# Patient Record
Sex: Male | Born: 1975 | Race: White | Hispanic: No | Marital: Married | Smoking: Never smoker
Health system: Southern US, Community
[De-identification: ages and names within clinical notes are randomized; demographics above are authoritative.]

## PROBLEM LIST (undated history)

## (undated) DIAGNOSIS — R531 Weakness: Secondary | ICD-10-CM

## (undated) DIAGNOSIS — F419 Anxiety disorder, unspecified: Secondary | ICD-10-CM

## (undated) DIAGNOSIS — R4701 Aphasia: Secondary | ICD-10-CM

## (undated) DIAGNOSIS — G4730 Sleep apnea, unspecified: Secondary | ICD-10-CM

## (undated) HISTORY — DX: Anxiety disorder, unspecified: F41.9

## (undated) HISTORY — DX: Weakness: R53.1

## (undated) HISTORY — DX: Aphasia: R47.01

## (undated) HISTORY — PX: VENOUS ABLATION: SHX2656

## (undated) HISTORY — PX: GALLBLADDER SURGERY: SHX652

---

## 2015-04-02 DIAGNOSIS — M25519 Pain in unspecified shoulder: Secondary | ICD-10-CM | POA: Insufficient documentation

## 2015-11-17 DIAGNOSIS — M79606 Pain in leg, unspecified: Secondary | ICD-10-CM | POA: Insufficient documentation

## 2015-11-17 DIAGNOSIS — R609 Edema, unspecified: Secondary | ICD-10-CM | POA: Insufficient documentation

## 2016-06-08 DIAGNOSIS — R600 Localized edema: Secondary | ICD-10-CM | POA: Insufficient documentation

## 2016-08-16 DIAGNOSIS — G4733 Obstructive sleep apnea (adult) (pediatric): Secondary | ICD-10-CM

## 2016-08-16 HISTORY — DX: Obstructive sleep apnea (adult) (pediatric): G47.33

## 2016-08-16 HISTORY — DX: Morbid (severe) obesity due to excess calories: E66.01

## 2019-04-01 DIAGNOSIS — M6208 Separation of muscle (nontraumatic), other site: Secondary | ICD-10-CM

## 2019-04-01 DIAGNOSIS — I8393 Asymptomatic varicose veins of bilateral lower extremities: Secondary | ICD-10-CM | POA: Insufficient documentation

## 2019-04-01 HISTORY — DX: Asymptomatic varicose veins of bilateral lower extremities: I83.93

## 2019-04-01 HISTORY — DX: Separation of muscle (nontraumatic), other site: M62.08

## 2019-10-01 ENCOUNTER — Other Ambulatory Visit: Payer: Self-pay | Admitting: Nurse Practitioner

## 2019-10-01 DIAGNOSIS — R42 Dizziness and giddiness: Secondary | ICD-10-CM

## 2019-10-26 ENCOUNTER — Other Ambulatory Visit: Payer: Self-pay | Admitting: Orthopedic Surgery

## 2019-10-26 DIAGNOSIS — M25561 Pain in right knee: Secondary | ICD-10-CM

## 2019-10-28 ENCOUNTER — Other Ambulatory Visit: Payer: Self-pay

## 2019-10-28 ENCOUNTER — Ambulatory Visit
Admission: RE | Admit: 2019-10-28 | Discharge: 2019-10-28 | Disposition: A | Discharge status: Home / Self Care | Payer: 59 | Source: Ambulatory Visit | Attending: Orthopedic Surgery | Admitting: Orthopedic Surgery

## 2019-10-28 DIAGNOSIS — M25561 Pain in right knee: Secondary | ICD-10-CM

## 2019-10-28 IMAGING — MR MR KNEE*R* W/O CM
6 series · 40 of 40 positions shown · non-contrast
Comparison: None.

CLINICAL DATA: Right knee pain status post fall. Injured playing
basketball.

EXAM:
MRI OF THE RIGHT KNEE WITHOUT CONTRAST
TECHNIQUE: Multiplanar, multisequence MR imaging of the knee was performed. No
intravenous contrast was administered.

[Series 3: T2 fat-sat · axial · right · 4.0mm · 0.62mm/px · z∈[-38,+97]mm · 6 of 32 slices shown (1 of 3)]
[im 1/32]
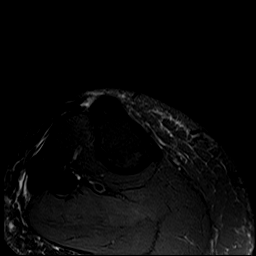
[im 7/32]
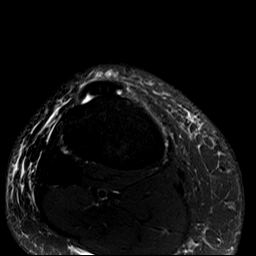
[im 13/32]
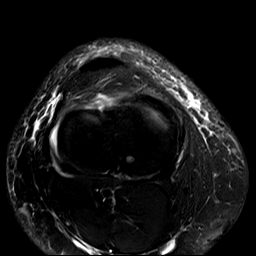
[im 19/32]
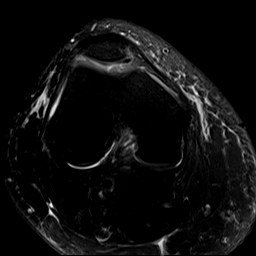
[im 25/32]
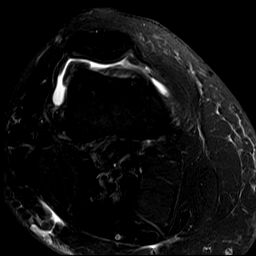
[im 32/32]
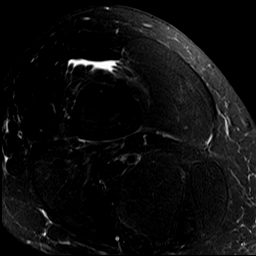

[Series 4: T2 fat-sat · coronal · right · 4.0mm · 0.78mm/px · 6 of 29 slices shown (2 of 3)]
[im 1/29]
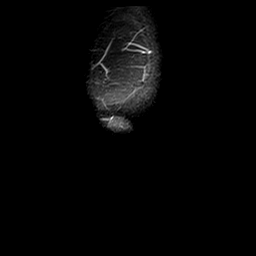
[im 6/29]
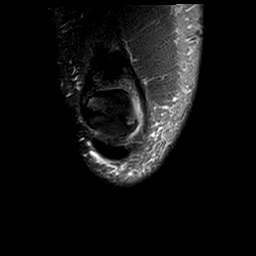
[im 12/29]
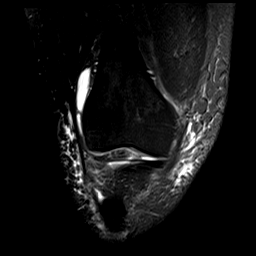
[im 17/29]
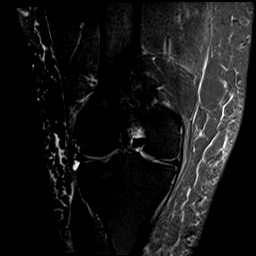
[im 23/29]
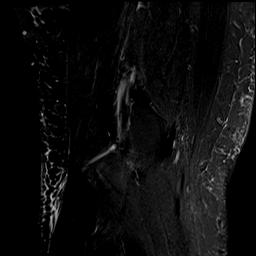
[im 29/29]
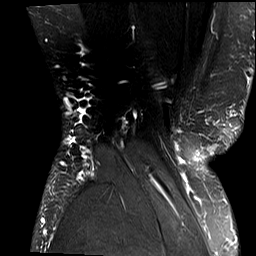

[Series 5: T1 · coronal · right · 4.0mm · 0.62mm/px · 7 of 29 slices shown]
[im 1/29]
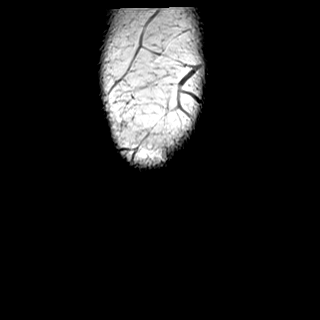
[im 5/29]
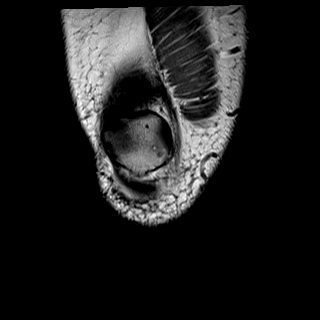
[im 10/29]
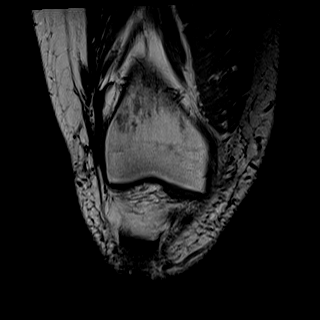
[im 15/29]
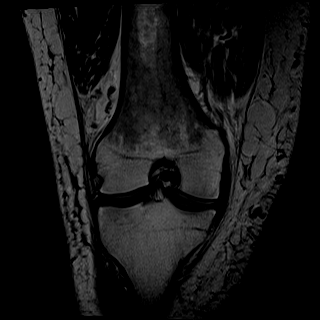
[im 19/29]
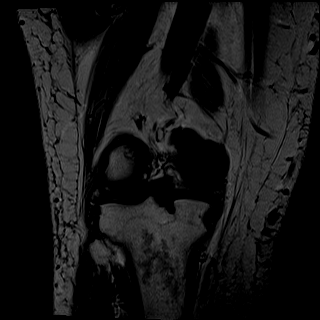
[im 24/29]
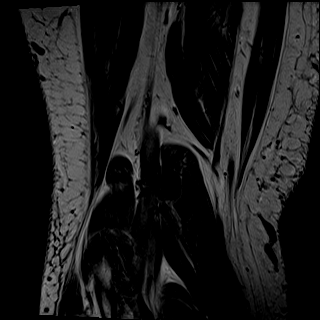
[im 29/29]
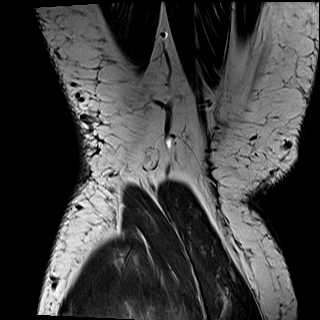

[Series 6: PD fat-sat · coronal · right · 3.3mm · 0.78mm/px · 7 of 29 slices shown (1 of 2)]
[im 1/29]
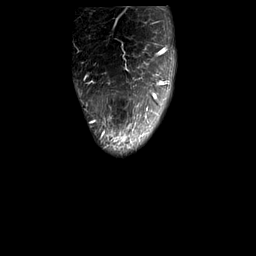
[im 5/29]
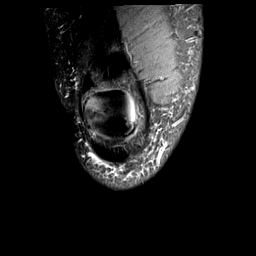
[im 10/29]
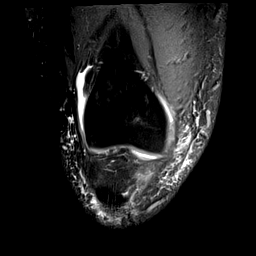
[im 15/29]
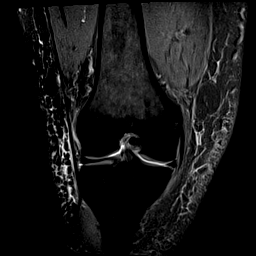
[im 19/29]
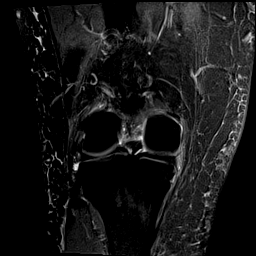
[im 24/29]
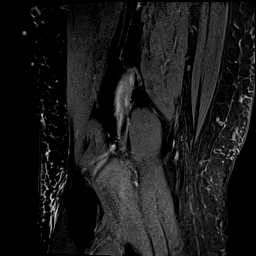
[im 29/29]
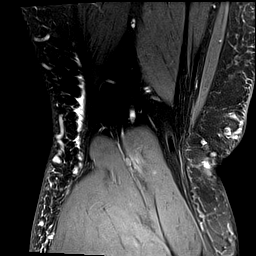

[Series 7: PD fat-sat · sagittal · right · 3.3mm · 0.50mm/px · 7 of 29 slices shown (2 of 2)]
[im 1/29]
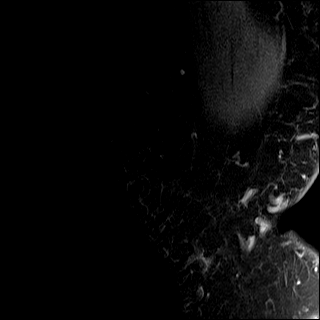
[im 5/29]
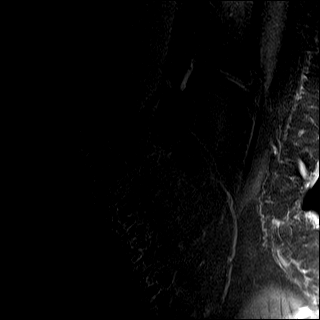
[im 10/29]
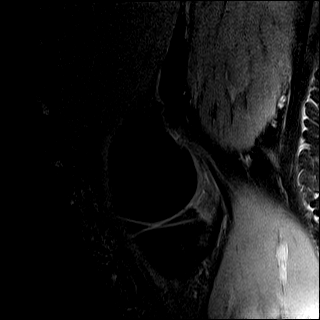
[im 15/29]
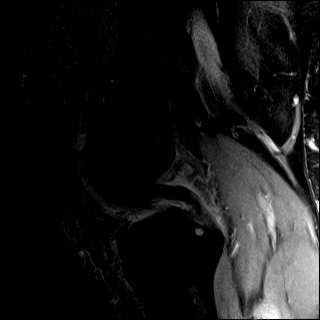
[im 19/29]
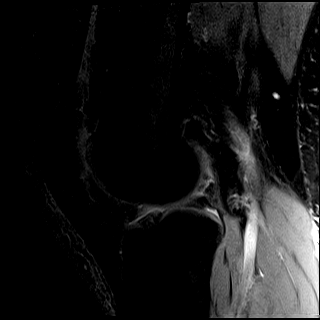
[im 24/29]
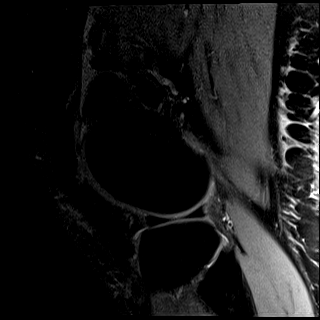
[im 29/29]
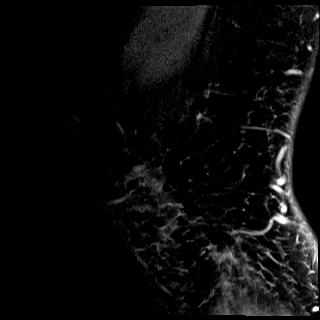

[Series 8: T2 fat-sat · sagittal · right · 3.3mm · 0.62mm/px · 7 of 29 slices shown (3 of 3)]
[im 1/29]
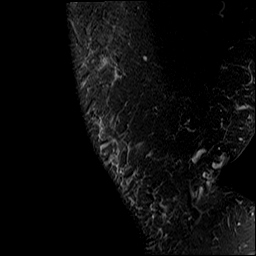
[im 5/29]
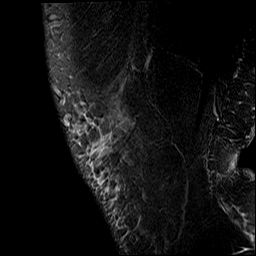
[im 10/29]
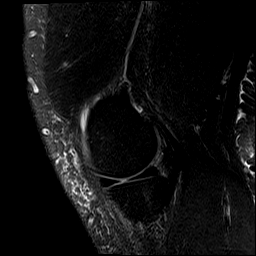
[im 15/29]
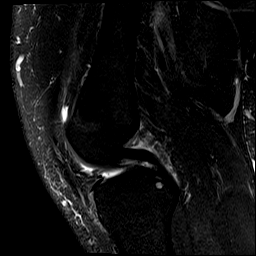
[im 19/29]
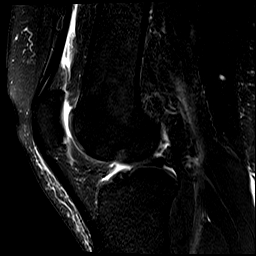
[im 24/29]
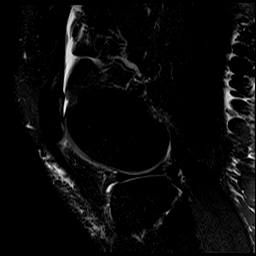
[im 29/29]
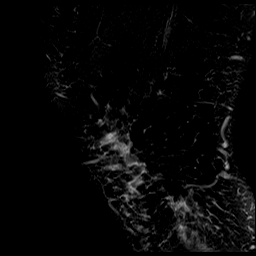

[40 of 40 positions shown; findings below may reference images not displayed]

FINDINGS: MENISCI

Medial meniscus: Degeneration of the body of the medial meniscus
with fraying along the undersurface. No discrete tear.

Lateral meniscus:  Intact.

LIGAMENTS

Cruciates:  Intact ACL and PCL.

Collaterals: Medial collateral ligament is intact. Lateral
collateral ligament complex is intact.

CARTILAGE

Patellofemoral: Partial-thickness cartilage loss of the lateral
patellar facet. Focal area of full-thickness cartilage loss of the
medial trochlea with subchondral reactive marrow edema.

Medial: Partial-thickness cartilage loss of the medial femorotibial
compartment.

Lateral:  No chondral defect.

Joint: Small joint effusion. Normal Hoffa's fat. No plical
thickening.

Popliteal Fossa:  No Baker cyst. Intact popliteus tendon.

Extensor Mechanism: Intact quadriceps tendon. Intact patellar
tendon. Intact medial patellar retinaculum. Intact lateral patellar
retinaculum. Intact MPFL.

Bones:  No acute osseous abnormality. No aggressive osseous lesion.

Other: No fluid collection or hematoma.  Muscles are normal.
IMPRESSION: 1. Degeneration of the body of the medial meniscus with fraying
along the undersurface without a discrete tear.
2. Partial-thickness cartilage loss of the medial femorotibial
compartment.
3. Partial-thickness cartilage loss of the lateral patellar facet.
Focal area of full-thickness cartilage loss of the medial trochlea
with subchondral reactive marrow edema.
4. Small joint effusion.

## 2019-11-22 ENCOUNTER — Other Ambulatory Visit: Payer: Self-pay

## 2020-04-27 ENCOUNTER — Telehealth (HOSPITAL_COMMUNITY): Payer: Self-pay | Admitting: Adult Health

## 2020-04-27 NOTE — Telephone Encounter (Signed)
Called patient about COVID positivity and his interest in receiving monoclonal antibody treatment.  He is feeling better and does not wish to come in any longer.  I wished him a continued speedy recovery.  Lillard Anes, NP

## 2020-09-20 ENCOUNTER — Other Ambulatory Visit: Payer: Self-pay

## 2020-09-20 DIAGNOSIS — R7303 Prediabetes: Secondary | ICD-10-CM | POA: Insufficient documentation

## 2020-09-20 DIAGNOSIS — I8393 Asymptomatic varicose veins of bilateral lower extremities: Secondary | ICD-10-CM

## 2020-09-20 HISTORY — DX: Prediabetes: R73.03

## 2020-10-09 ENCOUNTER — Other Ambulatory Visit: Payer: Self-pay

## 2020-10-09 ENCOUNTER — Encounter: Payer: Self-pay | Admitting: Vascular Surgery

## 2020-10-09 ENCOUNTER — Ambulatory Visit (INDEPENDENT_AMBULATORY_CARE_PROVIDER_SITE_OTHER): Payer: 59 | Admitting: Vascular Surgery

## 2020-10-09 ENCOUNTER — Ambulatory Visit (HOSPITAL_COMMUNITY)
Admission: RE | Admit: 2020-10-09 | Discharge: 2020-10-09 | Disposition: A | Discharge status: Home / Self Care | Payer: 59 | Source: Ambulatory Visit | Attending: Vascular Surgery | Admitting: Vascular Surgery

## 2020-10-09 VITALS — BP 123/84 | HR 79 | Temp 98.3°F | Resp 20 | Ht 71.0 in | Wt 298.0 lb

## 2020-10-09 DIAGNOSIS — I872 Venous insufficiency (chronic) (peripheral): Secondary | ICD-10-CM | POA: Diagnosis not present

## 2020-10-09 DIAGNOSIS — I8393 Asymptomatic varicose veins of bilateral lower extremities: Secondary | ICD-10-CM | POA: Insufficient documentation

## 2020-10-09 NOTE — Progress Notes (Signed)
Patient ID: Faolan Springfield, male   DOB: 1975/10/30, 45 y.o.   MRN: [id number]  Reason for Consult: New Patient (Initial Visit)   Referred by Julianne Handler, NP  Subjective:     HPI:  Roy Snyder is a 45 y.o. male has a history several years ago when he was living in [place] of what appears to be first saphenous vein stripping followed by saphenous vein ablation and also possible sclerotherapy.  He states this has occurred on both legs.  All procedures did help for a short period of time.  He has persistent swelling of the left greater than right leg with skin changes bilaterally left greater than right.  He also has some recurrent varicosities on the left side.  He states that his legs do ache particularly at the end of the day.  He currently wears knee-high compression stockings religiously.  He was previously in thigh-high compression stockings after his previous procedures.  He has no tissue loss or ulceration.  He has never had arterial treatments.  Past Medical History:  Diagnosis Date  . Anxiety    History reviewed. No pertinent family history. Past Surgical History:  Procedure Laterality Date  . VENOUS ABLATION Bilateral     Short Social History:  Social History   Tobacco Use  . Smoking status: Never Smoker  . Smokeless tobacco: Never Used  Substance Use Topics  . Alcohol use: Not Currently    No Known Allergies  Current Outpatient Medications  Medication Sig Dispense Refill  . cholecalciferol (VITAMIN D3) 25 MCG (1000 UNIT) tablet Take 1,000 Units by mouth daily.    . vitamin C (ASCORBIC ACID) 500 MG tablet Take 500 mg by mouth daily.    Marland Kitchen zinc gluconate 50 MG tablet Take 50 mg by mouth daily.    Marland Kitchen escitalopram (LEXAPRO) 10 MG tablet Take 10 mg by mouth daily.     No current facility-administered medications for this visit.    Review of Systems  Constitutional:  Constitutional negative. HENT: HENT negative.  Eyes: Eyes negative.  Respiratory:  Respiratory negative.  Cardiovascular: Positive for leg swelling.  GI: Gastrointestinal negative.  Skin:       Skin discoloration bilateral legs Neurological: Neurological negative. Hematologic: Hematologic/lymphatic negative.  Psychiatric: Psychiatric negative.        Objective:  Objective   Vitals:   10/09/20 1349  BP: 123/84  Pulse: 79  Resp: 20  Temp: 98.3 F (36.8 C)  SpO2: 96%  Weight: 298 lb (135.2 kg)  Height: 5\' 11"  (1.803 m)   Body mass index is 41.56 kg/m.  Physical Exam HENT:     Head: Normocephalic.     Nose:     Comments: Wearing a mask Cardiovascular:     Pulses: Normal pulses.  Pulmonary:     Effort: Pulmonary effort is normal.  Abdominal:     General: Abdomen is flat.  Musculoskeletal:     Right lower leg: Edema present.     Left lower leg: Edema present.  Skin:    Capillary Refill: Capillary refill takes less than 2 seconds.     Comments: Hemosiderin deposition left greater than right ankle  Neurological:     General: No focal deficit present.     Mental Status: He is alert.  Psychiatric:        Mood and Affect: Mood normal.        Behavior: Behavior normal.        Thought Content: Thought content normal.  Judgment: Judgment normal.     Data: Venous Reflux Times  +--------------+---------+------+-----------+------------+-----------------  ----+  RIGHT     Reflux NoRefluxReflux TimeDiameter cmsComments                      Yes                          +--------------+---------+------+-----------+------------+-----------------  ----+  CFV            yes  >1 second                    +--------------+---------+------+-----------+------------+-----------------  ----+  FV mid          yes  >1 second                    +--------------+---------+------+-----------+------------+-----------------  ----+   Popliteal   no                                +--------------+---------+------+-----------+------------+-----------------  ----+  GSV at [facility]        yes  >500 ms   0.45               +--------------+---------+------+-----------+------------+-----------------  ----+  GSV prox thigh      yes  >500 ms   0.34  vein courses in  GSV                              fascia ? remnant  GSV   +--------------+---------+------+-----------+------------+-----------------  ----+  GSV mid thigh       yes  >500 ms   0.42  vein courses in  GSV                              fascia ? remnant  GSV   +--------------+---------+------+-----------+------------+-----------------  ----+  GSV dist thigh      yes  >500 ms   0.55  vein courses in  GSV                              fascia ? remnant  GSV   +--------------+---------+------+-----------+------------+-----------------  ----+  SSV Pop Fossa no               0.2                +--------------+---------+------+-----------+------------+-----------------  ----+  SSV prox calf no               0.19               +--------------+---------+------+-----------+------------+-----------------  ----+  SSV mid calf no               0.2                +--------------+---------+------+-----------+------------+-----------------  ----+     +--------------+---------+------+-----------+------------+--------------+  LEFT     Reflux NoRefluxReflux TimeDiameter cmsComments                  Yes                      +--------------+---------+------+-----------+------------+--------------+  CFV             yes  >1 second                +--------------+---------+------+-----------+------------+--------------+  FV mid          yes  >1 second                +--------------+---------+------+-----------+------------+--------------+  Popliteal         yes  >1 second                +--------------+---------+------+-----------+------------+--------------+  GSV at [facility]        yes  >500 ms   0.62           +--------------+---------+------+-----------+------------+--------------+  GSV prox thighno               0.35           +--------------+---------+------+-----------+------------+--------------+  GSV mid thigh                     not visualized  +--------------+---------+------+-----------+------------+--------------+  GSV dist thigh                    not visualized  +--------------+---------+------+-----------+------------+--------------+  SSV Pop Fossa       yes  >500 ms   0.8            +--------------+---------+------+-----------+------------+--------------+  SSV prox calf       yes  >500 ms   0.79           +--------------+---------+------+-----------+------------+--------------+  SSV mid calf       yes  >500 ms   0.43           +--------------+---------+------+-----------+------------+--------------+         Assessment/Plan:     45 year old male with previous history of bilateral lower extremity venous interventions including stripping, ablation and what appears to be sclerotherapy.  He does have possible accessory saphenous or recurrent saphenous vein reflux on the right with a moderately sized vein and relatively mild swelling skin changes consistent with C4 a venous disease.  On the left side he also has C4 a venous disease  with what appears to be a large small saphenous vein that is refluxing.  We have discussed fitting him for thigh-high compression stockings.  He will follow-up in 3 months for consideration of left small saphenous vein ablation and evaluation for right greater saphenous vein recurrent reflux.     Maeola Harman MD [facility]

## 2020-10-11 ENCOUNTER — Ambulatory Visit: Payer: 59 | Admitting: Sports Medicine

## 2020-10-17 ENCOUNTER — Ambulatory Visit: Payer: 59 | Admitting: Sports Medicine

## 2020-10-17 ENCOUNTER — Encounter: Payer: Self-pay | Admitting: Sports Medicine

## 2020-10-17 DIAGNOSIS — B070 Plantar wart: Secondary | ICD-10-CM

## 2020-10-17 DIAGNOSIS — M79672 Pain in left foot: Secondary | ICD-10-CM

## 2020-10-17 DIAGNOSIS — R7303 Prediabetes: Secondary | ICD-10-CM

## 2020-10-17 NOTE — Progress Notes (Signed)
Subjective: Roy Snyder is a 44 y.o. male patient who presents to office for evaluation of Left foot pain secondary to moderately painful wart at the plantar mid-arch. Patient has not tried anything with no relief in symptoms. Patient denies any other pedal complaints.   Review of Systems  All other systems reviewed and are negative.    Patient Active Problem List   Diagnosis Date Noted  . Prediabetes 09/20/2020  . Asymptomatic varicose veins of both lower extremities 04/01/2019  . Diastasis recti 04/01/2019  . Morbid obesity with BMI of 40.0-44.9, adult (HCC) 08/16/2016  . Obstructive sleep apnea syndrome 08/16/2016    Current Outpatient Medications on File Prior to Visit  Medication Sig Dispense Refill  . cholecalciferol (VITAMIN D3) 25 MCG (1000 UNIT) tablet Take 1,000 Units by mouth daily.    Marland Kitchen escitalopram (LEXAPRO) 10 MG tablet Take 10 mg by mouth daily.    . vitamin C (ASCORBIC ACID) 500 MG tablet Take 500 mg by mouth daily.    Marland Kitchen zinc gluconate 50 MG tablet Take 50 mg by mouth daily.     No current facility-administered medications on file prior to visit.    No Known Allergies  Objective:  General: Alert and oriented x3 in no acute distress  Dermatology: Keratotic lesion present measures approximately less than 0.5cm at plantar mid-arch on left with no skin lines transversing the lesion, pain is present with medial lateral pressure to the lesion, capillaries with pin point bleeding noted, no webspace macerations, no ecchymosis bilateral, all nails x 10 are well manicured.  Vascular: Dorsalis Pedis and Posterior Tibial pedal pulses 2/4, Capillary Fill Time 3 seconds, + pedal hair growth bilateral, no edema bilateral lower extremities, Temperature gradient within normal limits.  Neurology: Michaell Cowing sensation intact via light touch bilateral.  Musculoskeletal: Mild tenderness with palpation at the lesion site on left arch, Muscular strength 5/5 in all groups without pain or  limitation on range of motion. + Pes planus foot type.   Assessment and Plan: Problem List Items Addressed This Visit      Other   Prediabetes    Other Visit Diagnoses    Plantar wart of left foot    -  Primary   Left foot pain          -Complete examination performed -Discussed treatment options for wart -Parred keratoic warty lesion using a chisel blade x 1 left plantar mid-arch; treated the area with Catharidin covered with bandaid; Advised patient of blistering reaction that will occur from application of medication and once this happens replace bandaid with neosporin and tape/bandaid -Advised good hygiene habits and spraying out shoes weekly using Lysol -Patient to return to office in 1 month or sooner if condition worsens.  Asencion Islam, DPM

## 2020-11-15 ENCOUNTER — Ambulatory Visit: Payer: 59 | Admitting: Sports Medicine

## 2020-11-29 ENCOUNTER — Ambulatory Visit: Payer: 59 | Admitting: Sports Medicine

## 2021-01-11 ENCOUNTER — Ambulatory Visit (INDEPENDENT_AMBULATORY_CARE_PROVIDER_SITE_OTHER): Payer: 59 | Admitting: Vascular Surgery

## 2021-01-11 ENCOUNTER — Other Ambulatory Visit: Payer: Self-pay

## 2021-01-11 ENCOUNTER — Encounter: Payer: Self-pay | Admitting: Vascular Surgery

## 2021-01-11 VITALS — BP 124/81 | HR 75 | Temp 98.5°F | Resp 18 | Ht 71.0 in | Wt 300.0 lb

## 2021-01-11 DIAGNOSIS — I872 Venous insufficiency (chronic) (peripheral): Secondary | ICD-10-CM

## 2021-01-11 NOTE — Progress Notes (Signed)
REASON FOR VISIT:   Chronic venous insufficiency.  Follow-up visit.  MEDICAL ISSUES:   CHRONIC VENOUS INSUFFICIENCY: This patient has CEAP C4a venous disease.  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  He has for the most part deep venous reflux with some superficial venous reflux which by my exam is not amenable to laser angioplasty at this point.  We have discussed the importance of intermittent leg elevation and the proper positioning for this.  In addition I have encouraged him to continue to wear his compression stockings.  I think it knee-high compression stocking with a gradient of 15 to 20 mmHg would be more practical at this time of year.  I have encouraged him to avoid prolonged sitting and standing.  We have discussed the importance of exercise specifically walking and water aerobics.  In addition we have discussed the importance of maintaining healthy weight as central obesity especially increases lower extremity venous pressure.  If his symptoms progress in the future then certainly we could repeat his duplex to see if there is been any progression of his superficial venous disease.  In addition I have explained that in the winter especially it is important to keep the skin well lubricated to prevent any cracks in the skin which can result in ulceration.   HPI:   Roy Snyder is a pleasant 45 y.o. male  who was seen by Dr. Randie Heinz on 10/09/2020.  According to the notes several years ago when he was living in [place] he had a saphenous vein stripping and saphenous vein ablation and possible sclerotherapy.  He had procedures in both legs.  He was having some persistent swelling in both legs and also some skin changes bilaterally.  These were more significant on the left side than the right.  He had some recurrent varicose veins in the left leg also.  On his recent venous duplex scan there was evidence of an anterior accessory saphenous vein on the right and reflux and a small saphenous  vein on the left.  He had CEAP C4a venous disease.  He was encouraged to wear thigh-high compression stockings with a gradient of 20 to 30 mmHg, elevate his legs, and exercise.  He comes in for 1-month follow-up visit.  On my history, the patient states that he has had multiple previous venous procedures on both legs.  It sounds like he has had laser ablation of both great saphenous veins, stab phlebectomies, and even had glue in both saphenous veins bilaterally.  He also describes a procedure where he was placed prone which would suggest that he had laser ablation of the small saphenous veins also.  This was all done 6 or 7 years ago according to the patient.  These procedures were done in [facility] and also in [place].  I do not have these records.  He denies any previous history of DVT.  He has had significant pain in both legs which she describes as aching pain and heaviness.  The symptoms are aggravated by standing and driving for a prolonged period of time.  His symptoms are alleviated with elevation.  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  He does do a fair amount of driving.  The compression stockings have not really helped significantly.  He does elevate his legs which does help.  His symptoms have been going on for a couple years and have been gradually progressive.  Past Medical History:  Diagnosis Date  Anxiety     No family history on file.  SOCIAL HISTORY: Social History   Tobacco Use   Smoking status: Never   Smokeless tobacco: Never  Substance Use Topics   Alcohol use: Not Currently    No Known Allergies  Current Outpatient Medications  Medication Sig Dispense Refill   cholecalciferol (VITAMIN D3) 25 MCG (1000 UNIT) tablet Take 1,000 Units by mouth daily.     escitalopram (LEXAPRO) 10 MG tablet Take 10 mg by mouth daily.     vitamin C (ASCORBIC ACID) 500 MG tablet Take 500 mg by mouth daily.     zinc gluconate 50 MG tablet Take 50  mg by mouth daily.     No current facility-administered medications for this visit.    REVIEW OF SYSTEMS:  [X]  denotes positive finding, [ ]  denotes negative finding Cardiac  Comments:  Chest pain or chest pressure:    Shortness of breath upon exertion:    Short of breath when lying flat:    Irregular heart rhythm:        Vascular    Pain in calf, thigh, or hip brought on by ambulation:    Pain in feet at night that wakes you up from your sleep:     Blood clot in your veins:    Leg swelling:  x       Pulmonary    Oxygen at home:    Productive cough:     Wheezing:         Neurologic    Sudden weakness in arms or legs:     Sudden numbness in arms or legs:     Sudden onset of difficulty speaking or slurred speech:    Temporary loss of vision in one eye:     Problems with dizziness:         Gastrointestinal    Blood in stool:     Vomited blood:         Genitourinary    Burning when urinating:     Blood in urine:        Psychiatric    Major depression:         Hematologic    Bleeding problems:    Problems with blood clotting too easily:        Skin    Rashes or ulcers:        Constitutional    Fever or chills:     PHYSICAL EXAM:   Vitals:   01/11/21 1459  BP: 124/81  Pulse: 75  Resp: 18  Temp: 98.5 F (36.9 C)  TempSrc: Temporal  SpO2: 97%  Weight: 300 lb (136.1 kg)  Height: 5\' 11"  (1.803 m)    GENERAL: The patient is a well-nourished male, in no acute distress. The vital signs are documented above. CARDIAC: There is a regular rate and rhythm.  VASCULAR: I do not detect carotid bruits. He has palpable pedal pulses bilaterally. He has hyperpigmentation bilaterally. I did look at his left small saphenous vein myself and he appears to have significant collateral developments in the calf proximally was difficult to identify a reasonable segment of small saphenous vein which might be amenable to laser angioplasty.  Likewise I looked at the right great  saphenous vein and there was a segment of reflux in the a segment of vein in the mid thigh but this became quite small approximately and I think this may represent some collateral development. PULMONARY: There is good air exchange bilaterally without wheezing  or rales. ABDOMEN: Soft and non-tender with normal pitched bowel sounds.  MUSCULOSKELETAL: There are no major deformities or cyanosis. NEUROLOGIC: No focal weakness or paresthesias are detected. SKIN: There are no ulcers or rashes noted. PSYCHIATRIC: The patient has a normal affect.  DATA:    VENOUS DUPLEX: I reviewed the venous duplex scan that was done on 10/09/2020.  On the right side there was no evidence of DVT or superficial venous thrombosis.  There was deep venous reflux involving the common femoral vein and femoral vein.  There was superficial venous reflux in a superficial vein in the proximal thigh with diameters ranging from 3.4 to 5.5 mm.  There was no reflux in the small saphenous vein which was not identified.    On the left side there was no evidence of DVT or superficial venous thrombosis.  There was deep venous reflux involving the common femoral vein, femoral vein, and popliteal vein.  There was no reflux in the great saphenous vein.  There was reflux in a segment of small saphenous vein on the left with diameters ranging from 4 mm to 8 mm.   Waverly Ferrari [facility] [phone]

## 2021-04-12 ENCOUNTER — Emergency Department (HOSPITAL_COMMUNITY): Payer: 59

## 2021-04-12 ENCOUNTER — Other Ambulatory Visit: Payer: Self-pay

## 2021-04-12 ENCOUNTER — Encounter (HOSPITAL_COMMUNITY): Payer: Self-pay | Admitting: Radiology

## 2021-04-12 ENCOUNTER — Observation Stay (HOSPITAL_COMMUNITY)
Admission: EM | Admit: 2021-04-12 | Discharge: 2021-04-13 | Disposition: A | Discharge status: Home / Self Care | Payer: 59 | Attending: Internal Medicine | Admitting: Internal Medicine

## 2021-04-12 DIAGNOSIS — Z20822 Contact with and (suspected) exposure to covid-19: Secondary | ICD-10-CM | POA: Diagnosis not present

## 2021-04-12 DIAGNOSIS — R519 Headache, unspecified: Secondary | ICD-10-CM | POA: Diagnosis present

## 2021-04-12 DIAGNOSIS — R7303 Prediabetes: Secondary | ICD-10-CM | POA: Diagnosis not present

## 2021-04-12 DIAGNOSIS — G459 Transient cerebral ischemic attack, unspecified: Secondary | ICD-10-CM

## 2021-04-12 DIAGNOSIS — R4189 Other symptoms and signs involving cognitive functions and awareness: Secondary | ICD-10-CM | POA: Diagnosis not present

## 2021-04-12 DIAGNOSIS — R4701 Aphasia: Secondary | ICD-10-CM | POA: Diagnosis not present

## 2021-04-12 DIAGNOSIS — G4482 Headache associated with sexual activity: Secondary | ICD-10-CM

## 2021-04-12 DIAGNOSIS — R531 Weakness: Secondary | ICD-10-CM | POA: Diagnosis not present

## 2021-04-12 DIAGNOSIS — G8911 Acute pain due to trauma: Secondary | ICD-10-CM

## 2021-04-12 DIAGNOSIS — G43009 Migraine without aura, not intractable, without status migrainosus: Secondary | ICD-10-CM

## 2021-04-12 DIAGNOSIS — M25511 Pain in right shoulder: Secondary | ICD-10-CM

## 2021-04-12 HISTORY — DX: Sleep apnea, unspecified: G47.30

## 2021-04-12 HISTORY — DX: Transient cerebral ischemic attack, unspecified: G45.9

## 2021-04-12 LAB — COMPREHENSIVE METABOLIC PANEL
ALT: 29 U/L (ref 0–44)
AST: 33 U/L (ref 15–41)
Albumin: 3.8 g/dL (ref 3.5–5.0)
Alkaline Phosphatase: 69 U/L (ref 38–126)
Anion gap: 5 (ref 5–15)
BUN: 13 mg/dL (ref 6–20)
CO2: 27 mmol/L (ref 22–32)
Calcium: 8.7 mg/dL — ABNORMAL LOW (ref 8.9–10.3)
Chloride: 104 mmol/L (ref 98–111)
Creatinine, Ser: 1.04 mg/dL (ref 0.61–1.24)
GFR, Estimated: >60 mL/min (ref >60)
Glucose, Bld: 99 mg/dL (ref 70–99)
Potassium: 4.4 mmol/L (ref 3.5–5.1)
Sodium: 136 mmol/L (ref 135–145)
Total Bilirubin: 1.3 mg/dL — ABNORMAL HIGH (ref 0.3–1.2)
Total Protein: 6.8 g/dL (ref 6.5–8.1)

## 2021-04-12 LAB — I-STAT CHEM 8, ED
BUN: 18 mg/dL (ref 6–20)
Calcium, Ion: 1.12 mmol/L — ABNORMAL LOW (ref 1.15–1.40)
Chloride: 104 mmol/L (ref 98–111)
Creatinine, Ser: 1.1 mg/dL (ref 0.61–1.24)
Glucose, Bld: 100 mg/dL — ABNORMAL HIGH (ref 70–99)
HCT: 41 % (ref 39.0–52.0)
Hemoglobin: 13.9 g/dL (ref 13.0–17.0)
Potassium: 4.6 mmol/L (ref 3.5–5.1)
Sodium: 139 mmol/L (ref 135–145)
TCO2: 26 mmol/L (ref 22–32)

## 2021-04-12 LAB — DIFFERENTIAL
Abs Immature Granulocytes: 0.04 10*3/uL (ref 0.00–0.07)
Basophils Absolute: 0 10*3/uL (ref 0.0–0.1)
Basophils Relative: 1 %
Eosinophils Absolute: 0.2 10*3/uL (ref 0.0–0.5)
Eosinophils Relative: 2 %
Immature Granulocytes: 1 %
Lymphocytes Relative: 29 %
Lymphs Abs: 2.1 10*3/uL (ref 0.7–4.0)
Monocytes Absolute: 0.6 10*3/uL (ref 0.1–1.0)
Monocytes Relative: 8 %
Neutro Abs: 4.4 10*3/uL (ref 1.7–7.7)
Neutrophils Relative %: 59 %

## 2021-04-12 LAB — CBG MONITORING, ED
Glucose-Capillary: 94 mg/dL (ref 70–99)
Glucose-Capillary: 83 mg/dL (ref 70–99)

## 2021-04-12 LAB — CBC
HCT: 42.7 % (ref 39.0–52.0)
Hemoglobin: 14.8 g/dL (ref 13.0–17.0)
MCH: 31.1 pg (ref 26.0–34.0)
MCHC: 34.7 g/dL (ref 30.0–36.0)
MCV: 89.7 fL (ref 80.0–100.0)
Platelets: 212 10*3/uL (ref 150–400)
RBC: 4.76 MIL/uL (ref 4.22–5.81)
RDW: 13.2 % (ref 11.5–15.5)
WBC: 7.4 10*3/uL (ref 4.0–10.5)
nRBC: 0 % (ref 0.0–0.2)

## 2021-04-12 LAB — SARS CORONAVIRUS 2 (TAT 6-24 HRS): SARS Coronavirus 2: NEGATIVE

## 2021-04-12 LAB — HIV ANTIBODY (ROUTINE TESTING W REFLEX): HIV Screen 4th Generation wRfx: NONREACTIVE

## 2021-04-12 IMAGING — MR MR HEAD W/O CM
12 of 13 series · 44 of 48 positions shown · non-contrast
Comparison: Noncontrast head CT and CT angiogram head/neck
performed earlier today [DATE]. CT angiogram head/neck
[DATE].

CLINICAL DATA: Neuro deficit, acute, stroke suspected. Additional
provided: Acute onset of an awake unresponsive state.

EXAM:
MRI HEAD WITHOUT CONTRAST
TECHNIQUE: Multiplanar, multiecho pulse sequences of the brain and surrounding
structures were obtained without intravenous contrast.

[Series 5: DWI · axial · 3.0mm · 0.88mm/px · z∈[-135,+23]mm · 9 of 108 slices shown (1 of 4)]
[im 1/108]
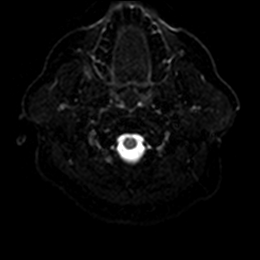
[im 14/108]
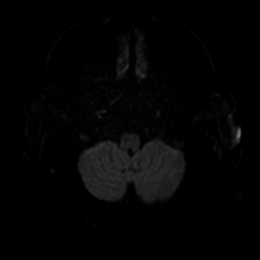
[im 27/108]
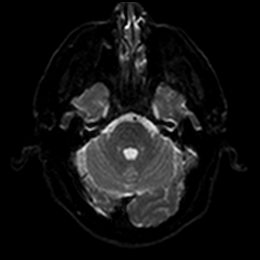
[im 41/108]
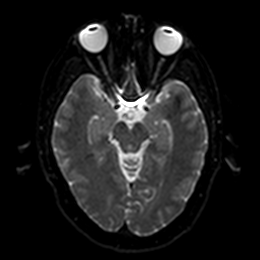
[im 54/108]
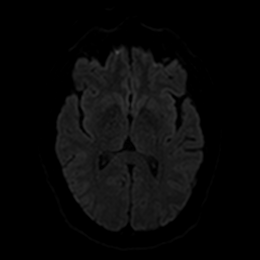
[im 67/108]
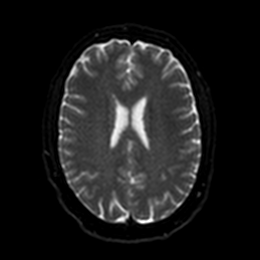
[im 81/108]
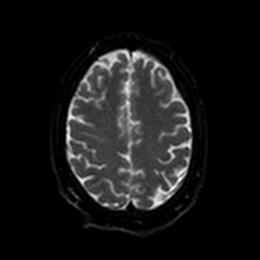
[im 94/108]
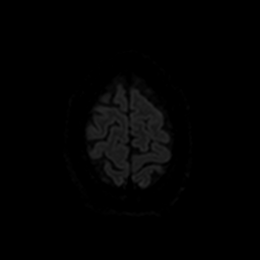
[im 108/108]
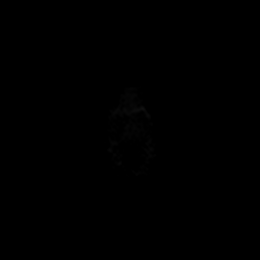

[Series 6: DWI · axial · 3.0mm · 0.88mm/px · z∈[-135,+23]mm · 4 of 54 slices shown (2 of 4)]
[im 1/54]
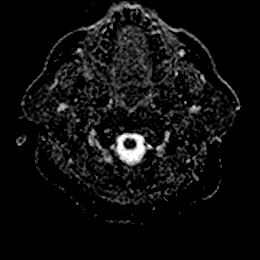
[im 18/54]
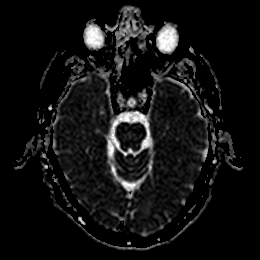
[im 36/54]
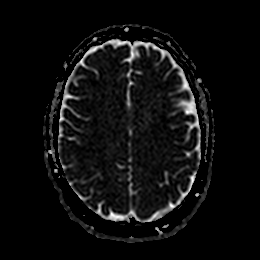
[im 54/54]
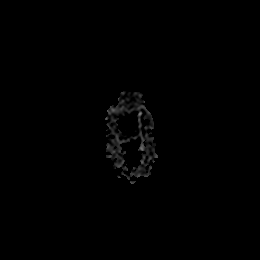

[Series 7: DWI · coronal · 4.0mm · 0.88mm/px · 5 of 64 slices shown (3 of 4)]
[im 1/64]
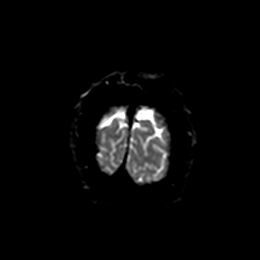
[im 16/64]
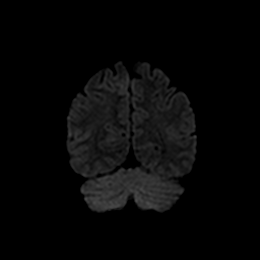
[im 32/64]
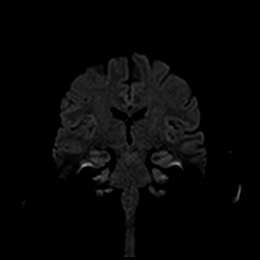
[im 48/64]
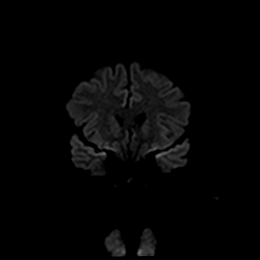
[im 64/64]
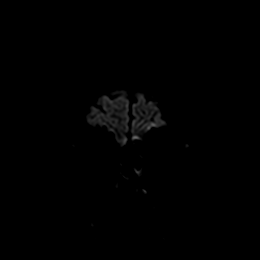

[Series 8: DWI · coronal · 4.0mm · 0.88mm/px · 2 of 32 slices shown (4 of 4)]
[im 1/32]
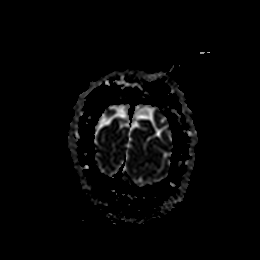
[im 32/32]
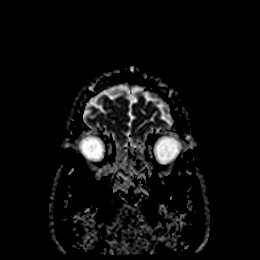

[Series 9: T1 · sagittal · 5.0mm · 0.75mm/px · 2 of 23 slices shown]
[im 1/23]
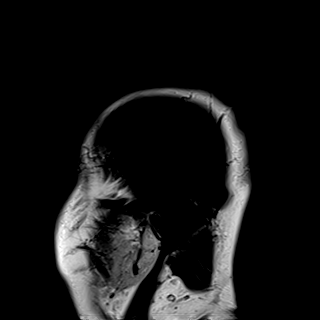
[im 23/23]
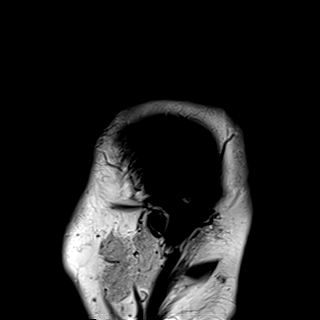

[Series 10: T2 · axial · 5.0mm · 0.72mm/px · z∈[-134,+27]mm · 2 of 28 slices shown (1 of 2)]
[im 1/28]
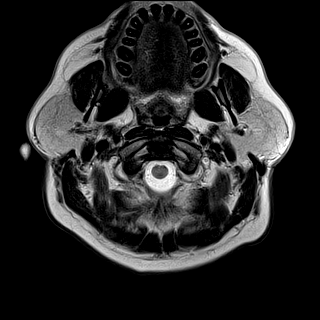
[im 28/28]
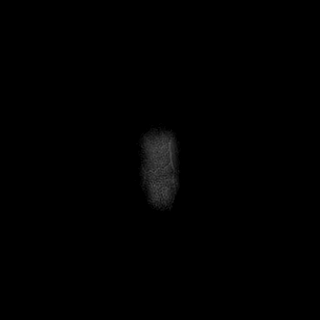

[Series 11: FLAIR · axial · 5.0mm · 0.45mm/px · z∈[-133,+29]mm · 2 of 28 slices shown]
[im 1/28]
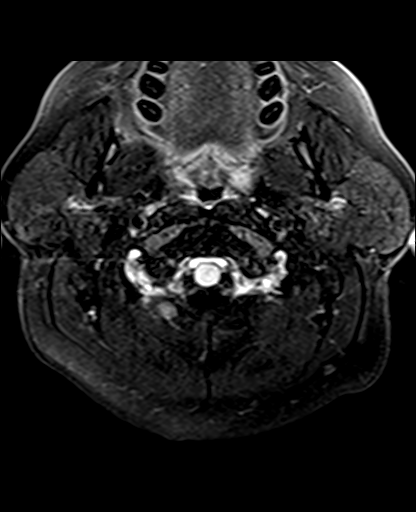
[im 28/28]
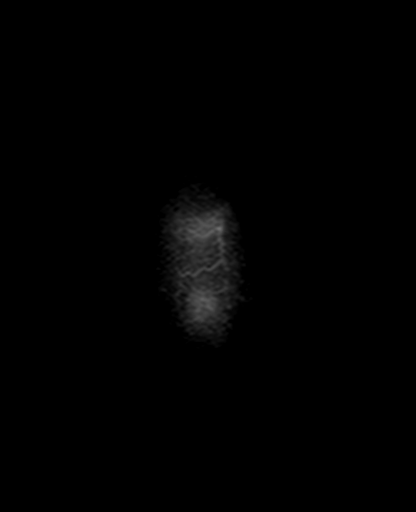

[Series 12: mag_images · axial · 3.0mm · 0.90mm/px · z∈[-141,+35]mm · 4 of 60 slices shown]
[im 1/60]
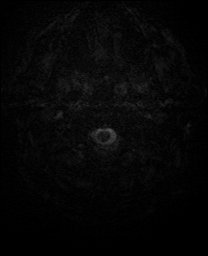
[im 20/60]
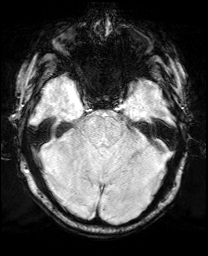
[im 40/60]
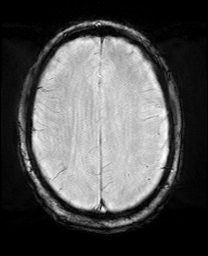
[im 60/60]
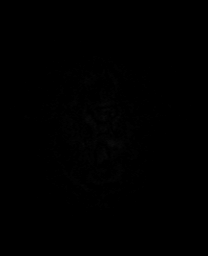

[Series 13: pha_images · axial · 3.0mm · 0.90mm/px · z∈[-141,+35]mm · 4 of 60 slices shown]
[im 1/60]
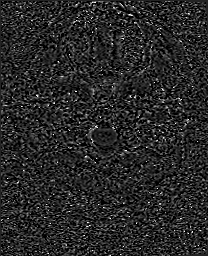
[im 20/60]
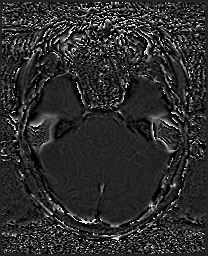
[im 40/60]
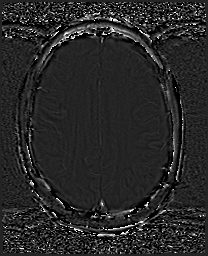
[im 60/60]
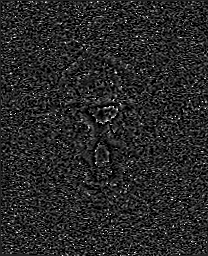

[Series 14: swi_images · axial · 3.0mm · 0.90mm/px · z∈[-141,+35]mm · 4 of 60 slices shown]
[im 1/60]
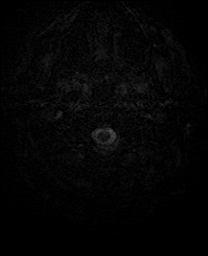
[im 20/60]
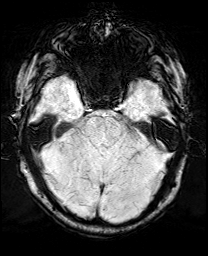
[im 40/60]
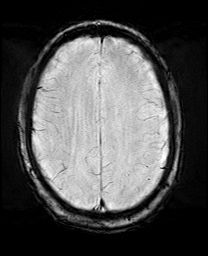
[im 60/60]
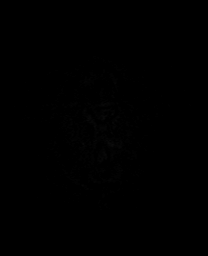

[Series 15: mip_images(sw) · axial · 24.0mm · 0.90mm/px · z∈[-131,+25]mm · 4 of 53 slices shown]
[im 1/53]
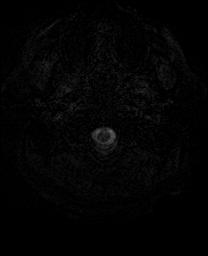
[im 18/53]
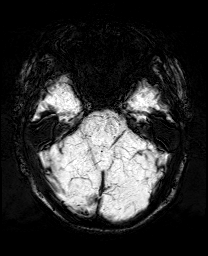
[im 35/53]
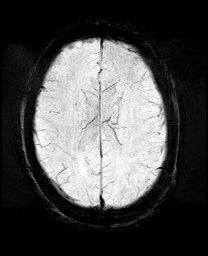
[im 53/53]
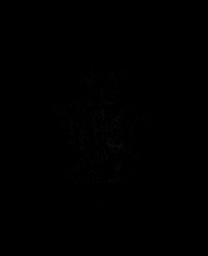

[Series 17: T2 · coronal · 5.0mm · 0.34mm/px · 2 of 29 slices shown (2 of 2)]
[im 1/29]
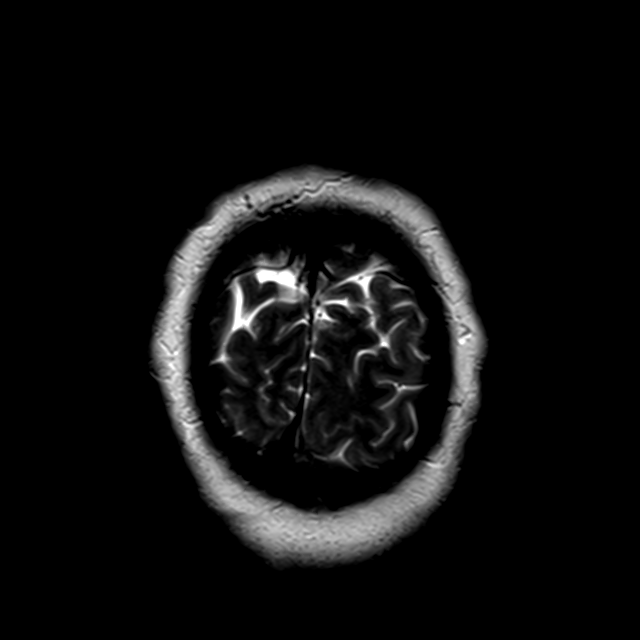
[im 29/29]
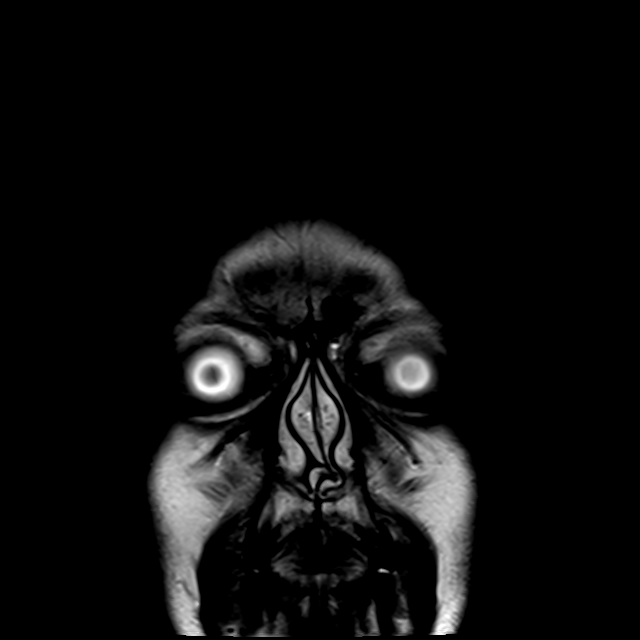

[44 of 48 positions shown; findings below may reference images not displayed]

FINDINGS: Brain:

Mild intermittent motion degradation.

Cerebral volume is normal.

No cortical encephalomalacia is identified. No significant cerebral
white matter disease.

There is no acute infarct.

No evidence of an intracranial mass.

No chronic intracranial blood products.

No extra-axial fluid collection.

No midline shift.

Partially empty sella turcica.

Vascular: Maintained flow voids within the proximal large arterial
vessels.

Skull and upper cervical spine: No focal suspicious marrow lesion.

Sinuses/Orbits: Visualized orbits show no acute finding. Trace
bilateral ethmoid sinus mucosal thickening.
IMPRESSION: Mildly motion degraded exam.

No evidence of acute intracranial abnormality.

Partially empty sella turcica. This finding is very commonly
incidental, but can be associated with idiopathic intracranial
hypertension.

Otherwise unremarkable non-contrast MRI appearance of the brain.

## 2021-04-12 IMAGING — CT CT ANGIO HEAD-NECK (W OR W/O PERF)
2 of 7 series · 8 of 33 positions shown · IV contrast (omnipaque)
Comparison: CT head [DATE], CTA head [DATE]

CLINICAL DATA: Stroke code, right-sided weakness

EXAM:
CT ANGIOGRAPHY HEAD AND NECK
TECHNIQUE: Multidetector CT imaging of the head and neck was performed using
the standard protocol during bolus administration of intravenous
contrast. Multiplanar CT image reconstructions and MIPs were
obtained to evaluate the vascular anatomy. Carotid stenosis
measurements (when applicable) are obtained utilizing NASCET
criteria, using the distal internal carotid diameter as the
denominator.
CONTRAST:  60mL OMNIPAQUE IOHEXOL 350 MG/ML SOLN

[Series 5: cta neck/head · axial · 0.58mm/px · z∈[-201,-77]mm · 2 of 186 slices shown]
[im 62/186  soft-tissue]
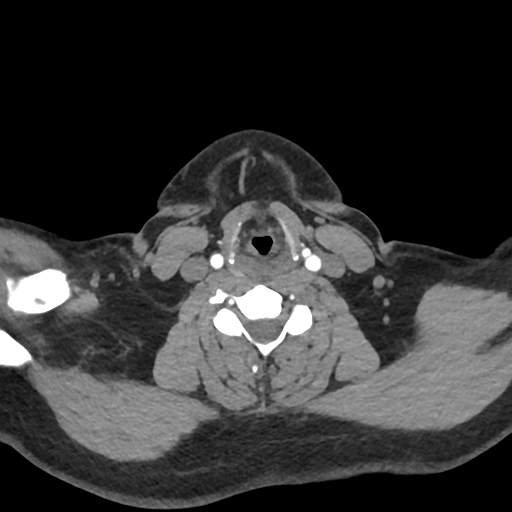
[im 124/186  soft-tissue]
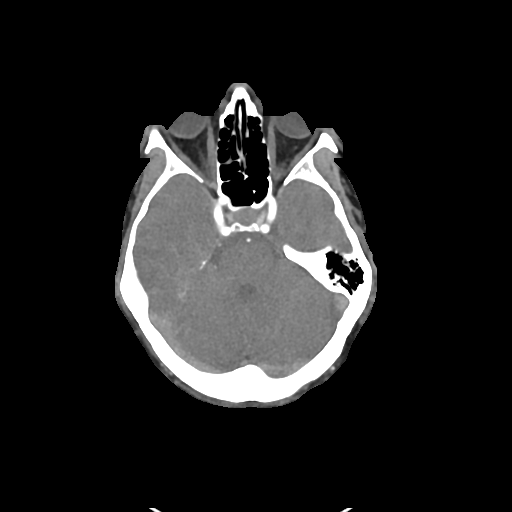

[Series 7: ax thins · axial · 0.54mm/px · z∈[-293,-24]mm · 6 of 378 slices shown]
[im 54/378  soft-tissue]
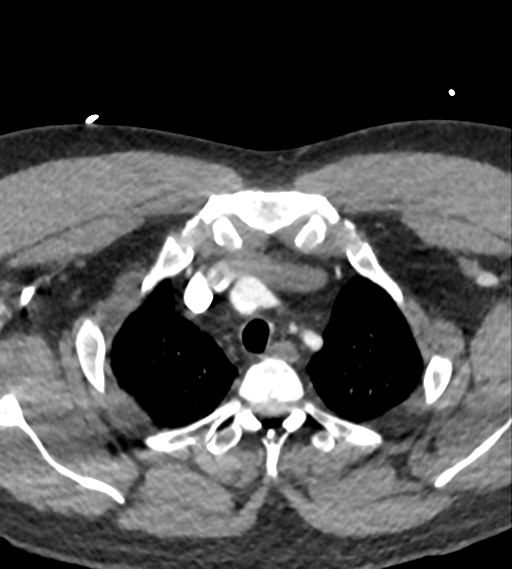
[im 108/378  bone]
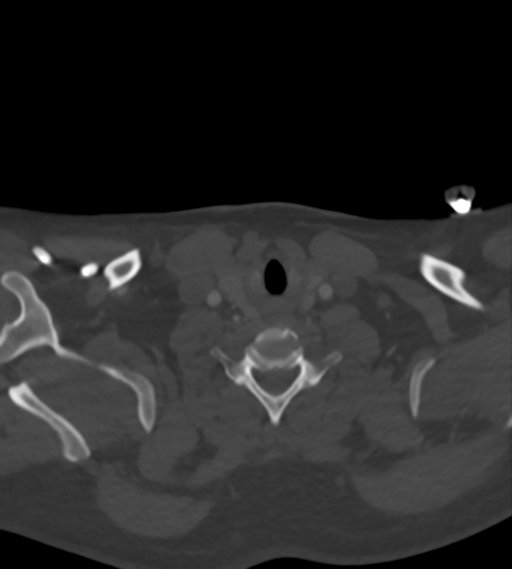
[im 162/378  soft-tissue]
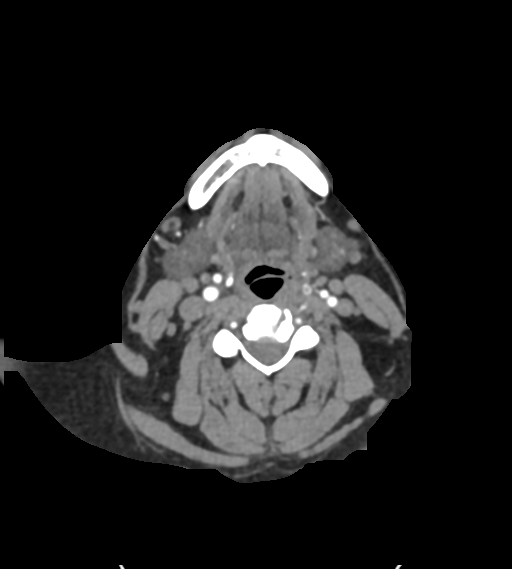
[im 216/378  bone]
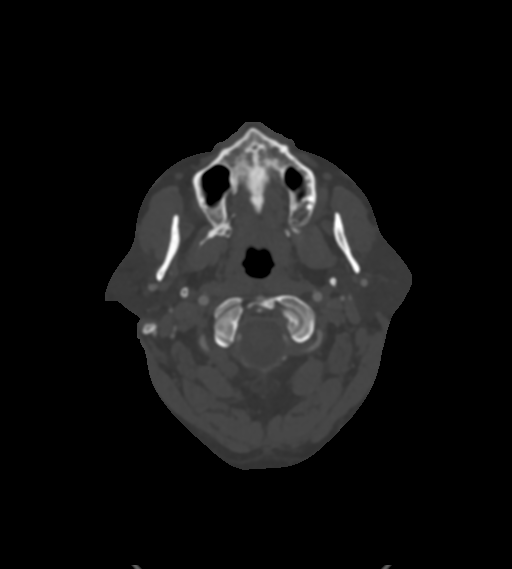
[im 270/378  soft-tissue]
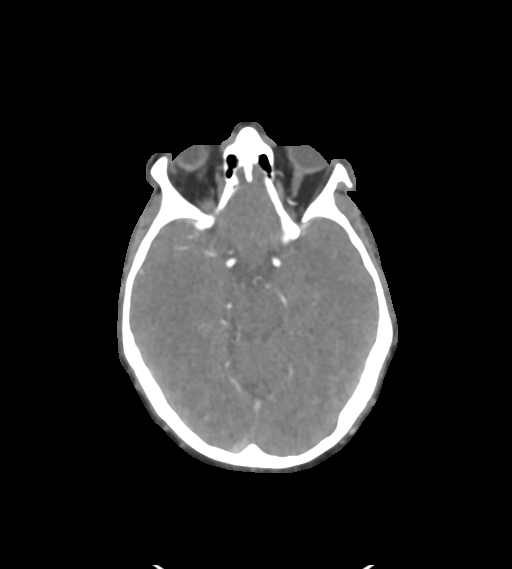
[im 324/378  bone]
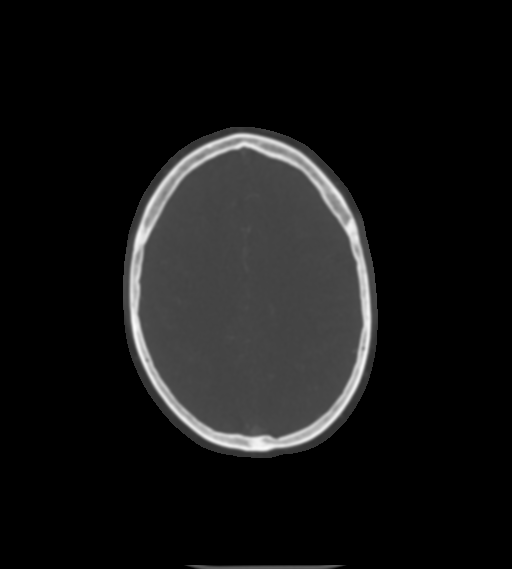

[8 of 33 positions shown; findings below may reference images not displayed]

FINDINGS: CT HEAD FINDINGS

Brain: No evidence of acute infarction, hemorrhage, cerebral edema,
mass, mass effect, or midline shift. Ventricles and sulci are normal
for age. No extra-axial fluid collection. Partial empty sella.

Vascular: No hyperdense vessel or unexpected calcification.

Skull: Normal. Negative for fracture or focal lesion.

Sinuses/Orbits: No acute finding.

Other: The mastoid air cells are well aerated.

Review of the MIP images confirms the above findings

CTA NECK FINDINGS

Aortic arch: Imaged portion shows no evidence of aneurysm or
dissection. No significant stenosis of the major arch vessel
origins.

Right carotid system: No evidence of dissection, stenosis (50% or
greater) or occlusion.

Left carotid system: No evidence of dissection, stenosis (50% or
greater) or occlusion.

Vertebral arteries: Right dominant vertebral system. No evidence of
dissection, stenosis (50% or greater) or occlusion.

Skeleton: No acute osseous abnormality.

Other neck: No soft tissue abnormality within the neck. No mass
lesion or adenopathy.

Upper chest: Negative.

Review of the MIP images confirms the above findings

CTA HEAD FINDINGS

Anterior circulation: Both internal carotid arteries are widely
patent to the termini, without stenosis or other abnormality. A1
segments patent. Normal anterior communicating artery. Anterior
cerebral arteries widely patent to their distal aspects. No M1
stenosis or occlusion. Normal MCA bifurcations. Distal MCA branches
well perfused and symmetric.

Posterior circulation: Vertebral arteries widely patent to the
vertebrobasilar junction without stenosis. Posterior inferior
cerebral arteries patent bilaterally. Basilar patent to its distal
aspect. Superior cerebral arteries patent bilaterally. PCAs well
perfused to their distal aspects without stenosis.

Venous sinuses: As permitted by contrast timing, patent.

Anatomic variants: Fetal origin of the right PCA. Near fetal origin
of the left PCA.

Review of the MIP images confirms the above findings
IMPRESSION: No large vessel occlusion, hemodynamically significant stenosis, or
other acute vascular abnormality.

## 2021-04-12 IMAGING — CT CT HEAD CODE STROKE
4 series · 16 of 47 positions shown, 18 images · non-contrast
Comparison: [DATE].

CLINICAL DATA: Code stroke.  Neuro deficit, acute, stroke suspected

EXAM:
CT HEAD WITHOUT CONTRAST
TECHNIQUE: Contiguous axial images were obtained from the base of the skull
through the vertex without intravenous contrast.

[Series 3: head wo · axial · 0.46mm/px · z∈[-106,+14]mm · 7 of 33 slices shown, 9 images]
[im 5/33  brain]
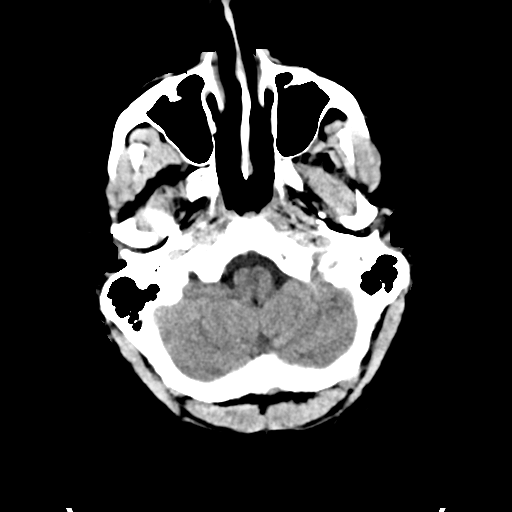
[im 5/33  bone]
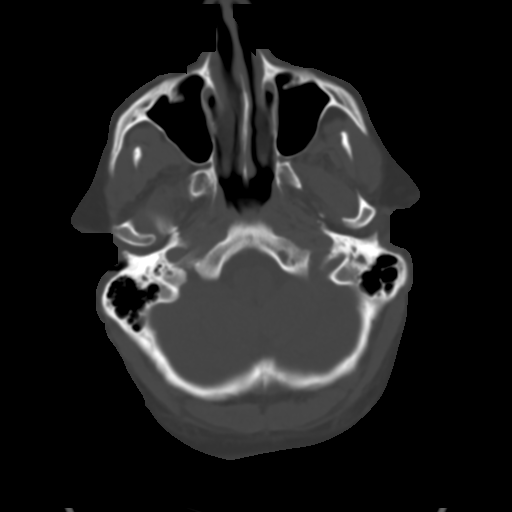
[im 9/33  brain]
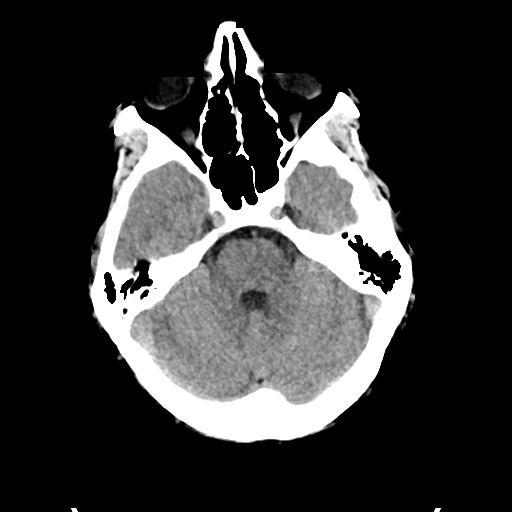
[im 13/33  brain]
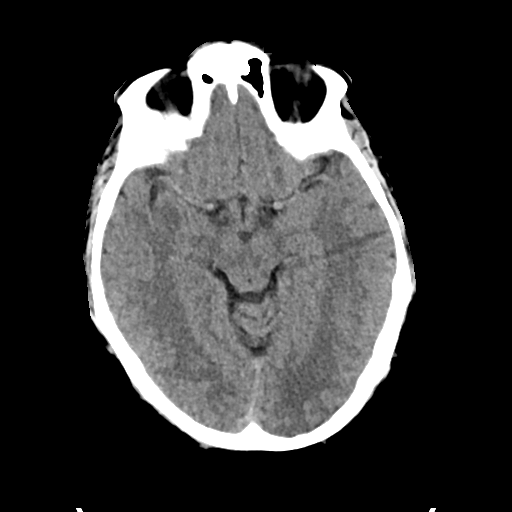
[im 17/33  brain]
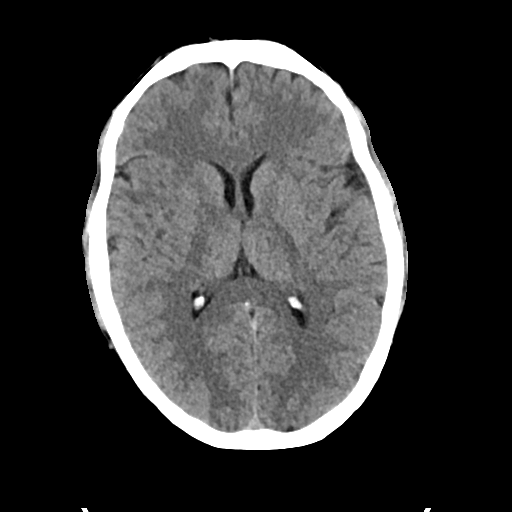
[im 21/33  brain]
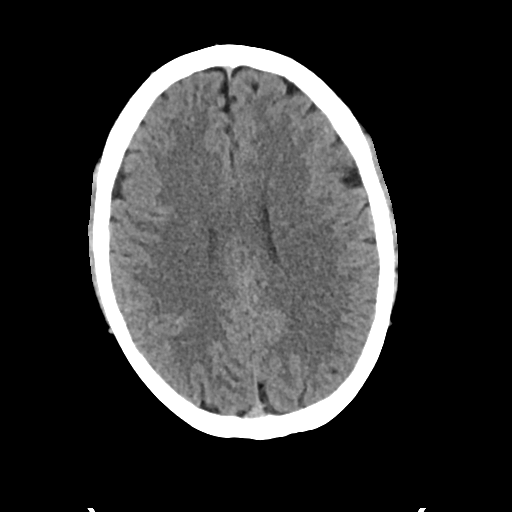
[im 21/33  bone]
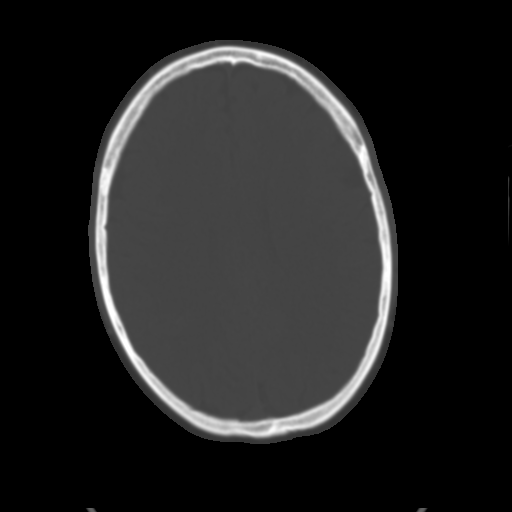
[im 25/33  brain]
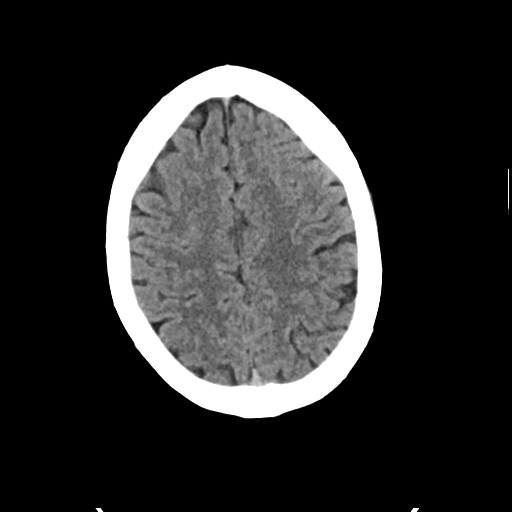
[im 29/33  brain]
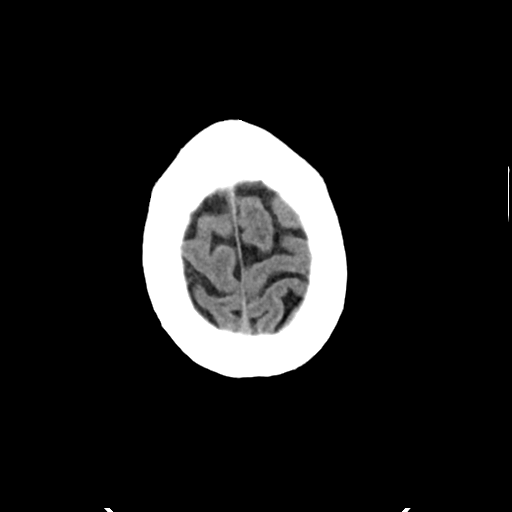

[Series 4: head bone · axial · 0.46mm/px · z∈[-110,-78]mm · 3 of 83 slices shown]
[im 9/83  bone]
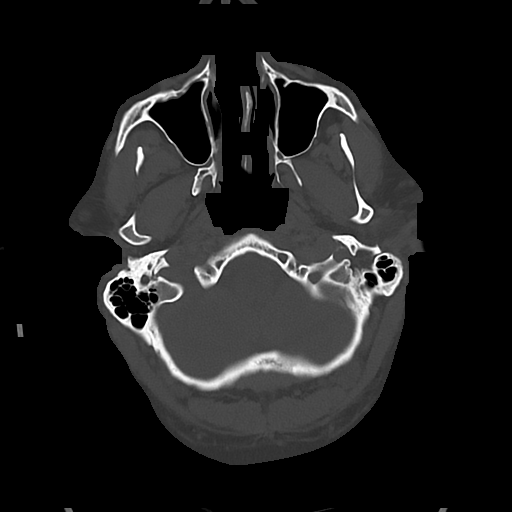
[im 17/83  bone]
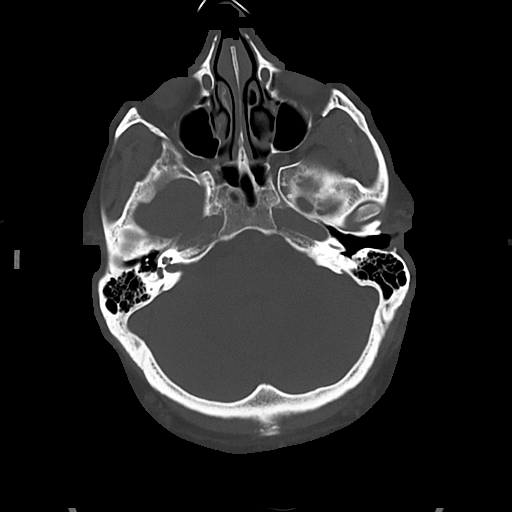
[im 25/83  bone]
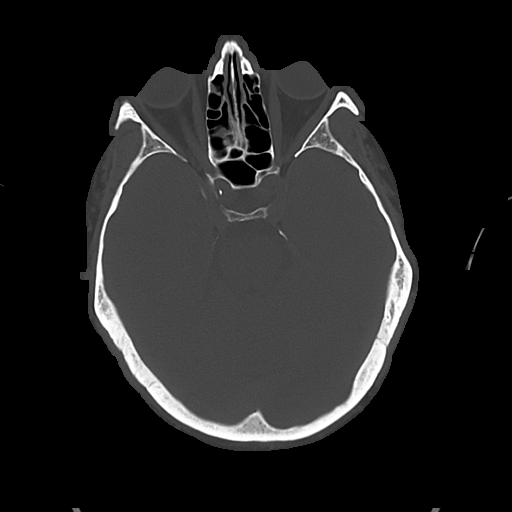

[Series 5: cor soft · coronal · 0.37mm/px · 3 of 71 slices shown]
[im 24/71  brain]
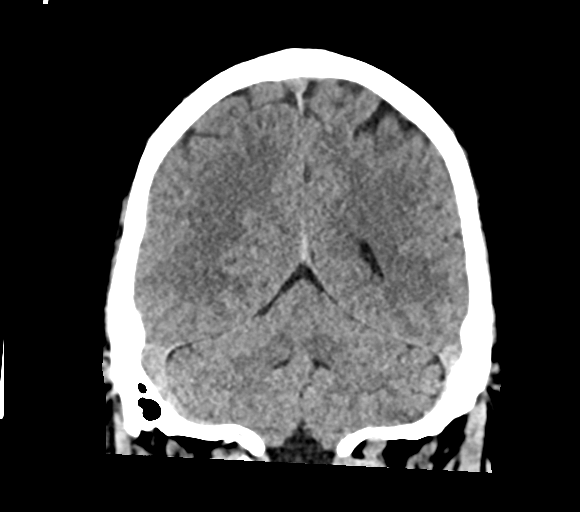
[im 32/71  brain]
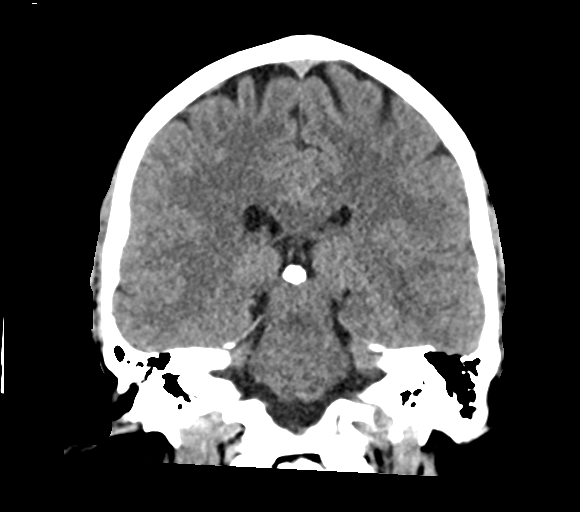
[im 39/71  brain]
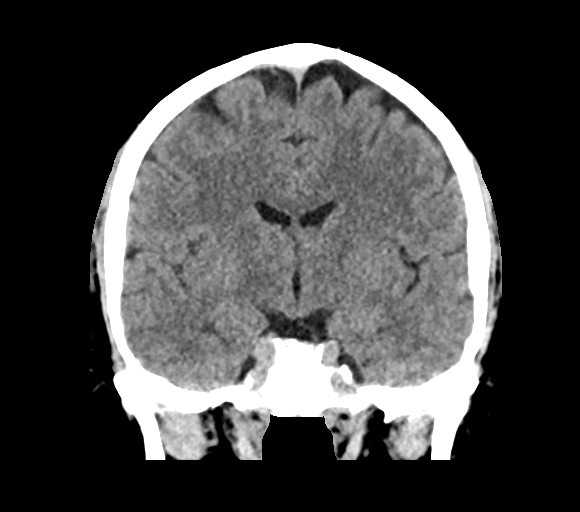

[Series 6: sag soft · sagittal · 0.35mm/px · 3 of 58 slices shown]
[im 20/58  brain]
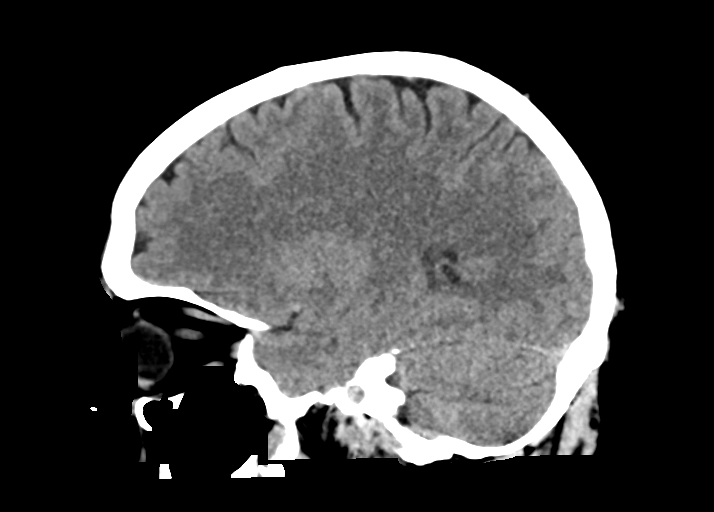
[im 29/58  brain]
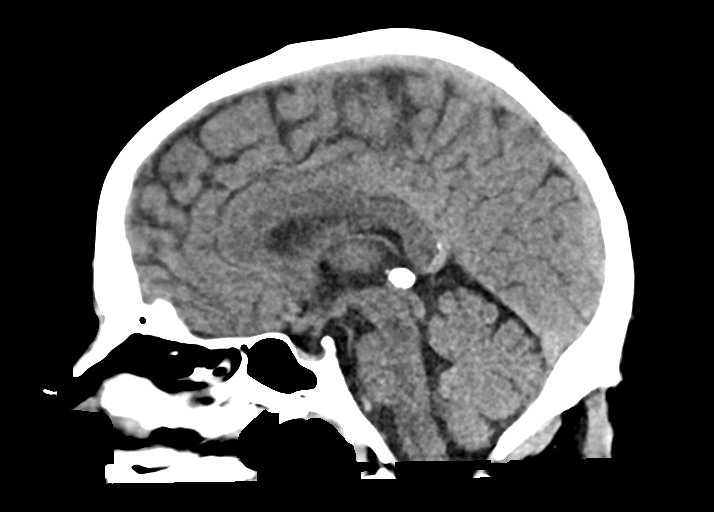
[im 39/58  brain]
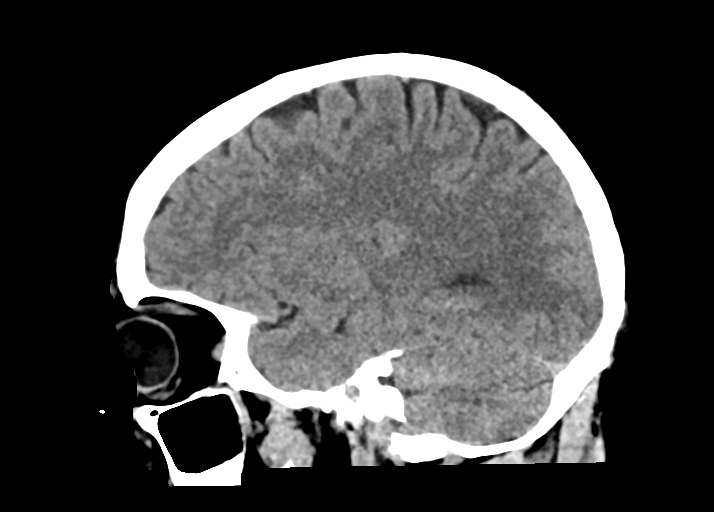

[16 of 47 positions shown; findings below may reference images not displayed]

FINDINGS: Brain: No evidence of acute large vascular territory infarction,
hemorrhage, hydrocephalus, extra-axial collection or mass
lesion/mass effect.

Vascular: No hyperdense vessel identified.

Skull: No acute fracture.

Sinuses/Orbits: Clear visualized sinuses. No acute orbital findings.

Other: No mastoid effusions.

ASPECTS ([place] Stroke Program Early CT Score) Total score (0-10
with 10 being normal): 10.
IMPRESSION: 1. No evidence of acute large vascular territory infarct or acute
hemorrhage. ASPECTS is 10.
2. Partially empty sella, which is often a normal anatomic variant
but can be associated with idiopathic intracranial hypertension.

am to provider Dr. RYLANT via secure text paging.

## 2021-04-12 MED ORDER — ONDANSETRON 4 MG PO TBDP: 4.0000 mg | ORAL_TABLET | Freq: Three times a day (TID) | ORAL | Status: DC | PRN

## 2021-04-12 MED ORDER — FAMOTIDINE 20 MG PO TABS
20.0000 mg | ORAL_TABLET | Freq: Two times a day (BID) | ORAL | Status: DC
  Administered 2021-04-12 – 2021-04-13 (×2): 20 mg via ORAL
  Filled 2021-04-12 (×2): qty 1

## 2021-04-12 MED ORDER — SODIUM CHLORIDE 0.9% FLUSH
3.0000 mL | Freq: Once | INTRAVENOUS | Status: AC
  Administered 2021-04-12: 3 mL via INTRAVENOUS

## 2021-04-12 MED ORDER — IOHEXOL 350 MG/ML SOLN
60.0000 mL | Freq: Once | INTRAVENOUS | Status: AC | PRN
  Administered 2021-04-12: 60 mL via INTRAVENOUS

## 2021-04-12 MED ORDER — ACETAMINOPHEN 160 MG/5ML PO SOLN: 650.0000 mg | ORAL | Status: DC | PRN

## 2021-04-12 MED ORDER — FLUTICASONE PROPIONATE 50 MCG/ACT NA SUSP
1.0000 | Freq: Every day | NASAL | Status: DC
  Filled 2021-04-12 (×2): qty 16

## 2021-04-12 MED ORDER — SODIUM CHLORIDE 0.9 % IV SOLN: INTRAVENOUS | Status: DC

## 2021-04-12 MED ORDER — ACETAMINOPHEN 650 MG RE SUPP: 650.0000 mg | RECTAL | Status: DC | PRN

## 2021-04-12 MED ORDER — SENNOSIDES-DOCUSATE SODIUM 8.6-50 MG PO TABS: 1.0000 | ORAL_TABLET | Freq: Every evening | ORAL | Status: DC | PRN

## 2021-04-12 MED ORDER — ACETAMINOPHEN 325 MG PO TABS
650.0000 mg | ORAL_TABLET | ORAL | Status: DC | PRN
  Administered 2021-04-12: 650 mg via ORAL
  Filled 2021-04-12: qty 2

## 2021-04-12 MED ORDER — ASPIRIN EC 81 MG PO TBEC
81.0000 mg | DELAYED_RELEASE_TABLET | Freq: Every day | ORAL | Status: DC
  Administered 2021-04-12 – 2021-04-13 (×2): 81 mg via ORAL
  Filled 2021-04-12 (×2): qty 1

## 2021-04-12 MED ORDER — ENOXAPARIN SODIUM 40 MG/0.4ML IJ SOSY
40.0000 mg | PREFILLED_SYRINGE | INTRAMUSCULAR | Status: DC
  Administered 2021-04-12: 40 mg via SUBCUTANEOUS
  Filled 2021-04-12 (×2): qty 0.4

## 2021-04-12 MED ORDER — STROKE: EARLY STAGES OF RECOVERY BOOK
Freq: Once | Status: DC
  Filled 2021-04-12 (×2): qty 1

## 2021-04-12 MED ORDER — ESCITALOPRAM OXALATE 10 MG PO TABS
10.0000 mg | ORAL_TABLET | Freq: Every day | ORAL | Status: DC
  Administered 2021-04-12: 10 mg via ORAL
  Filled 2021-04-12: qty 1

## 2021-04-12 MED ORDER — DICYCLOMINE HCL 20 MG PO TABS
20.0000 mg | ORAL_TABLET | Freq: Three times a day (TID) | ORAL | Status: DC | PRN
  Administered 2021-04-12: 20 mg via ORAL
  Filled 2021-04-12 (×2): qty 1

## 2021-04-12 MED ORDER — LOPERAMIDE HCL 2 MG PO CAPS: 2.0000 mg | ORAL_CAPSULE | Freq: Two times a day (BID) | ORAL | Status: DC | PRN

## 2021-04-12 MED ORDER — PANTOPRAZOLE SODIUM 40 MG PO TBEC
40.0000 mg | DELAYED_RELEASE_TABLET | Freq: Every day | ORAL | Status: DC
  Administered 2021-04-12 – 2021-04-13 (×2): 40 mg via ORAL
  Filled 2021-04-12 (×2): qty 1

## 2021-04-12 NOTE — Code Documentation (Signed)
Pt is a 45 yr old male. He was last known well at 0915. He had a sudden onset of H/A while lifting weights, then collapsed. EMS was called. Code stroke activated at 1016. Pt arrived [facility] at 1023. He was weak bilaterally, worse on rt. He was mute, but could nod appropriately. CBG, labs obtained at bridge. Airway cleared by EDP. Taken to CT 1 at 1025. CT negative for acute abnormality per Neurologist. MD contacted pt's wife Olon Russ for additional history while RRN obtained IV access for CTA. MD reviewing risk and benefits of TNK with with Wife (via phone) and patient. Pt regained his ability to speak at 1042 and said "No" to TNK. CTA obtained to R/O LVO. CTA neg for acute LVO per Dr Otelia Limes. Pt taken back to room 9 where he will continue workup. No TNK as pt refused, symptoms improving, and low suspicion of stroke. No NIR as CTA negative for LVO. Pt will need q 2 VS and mNIHSS as workup continues. Bedside handoff with Alena RN.

## 2021-04-12 NOTE — ED Notes (Signed)
Pt transported to MRI 

## 2021-04-12 NOTE — Consult Note (Addendum)
NEURO HOSPITALIST CONSULT NOTE   Requestig physician: Dr. Rush Landmark  Reason for Consult:Acute onset of an awake unresponsive state  History obtained from:  Wife, EMS and Chart     HPI:                                                                                                                                          Roy Snyder is an 45 y.o. male with a PMHx of obesity, sleep apnea and anxiety, presenting as a Code Stroke via EMS after acute onset at home of an awake unresponsive state. LKN was 0915. He was in [facility] this AM when he went to lift weights. He went back home and at some point he had sudden onset of headache. He then collapsed on the floor with eyes open while speaking with his son; he was nonverbal. Son denied LOC, just collapse to floor in an awake state. Children called mother to alert her and EMS was called. Mother then rushed home and on arrival saw EMS attending to her husband, who was awake but nonverbal and not following commands. Per EMS, he was aphasic and weak all over, but weak on the entire right side and EMS by exam noted what seemed to be numbness on the entire right side as well. On arrival to the ED, he remained mute with eyes open, but could nod appropriately.   Per wife, he has had two similar episodes in the past, both with full spontaneous resolution.   Past Medical History:  Diagnosis Date   Anxiety    Sleep apnea     Past Surgical History:  Procedure Laterality Date   VENOUS ABLATION Bilateral     No family history on file.           Social History:  reports that he has never smoked. He has never used smokeless tobacco. He reports that he does not currently use alcohol. He reports that he does not use drugs.  No Known Allergies  HOME MEDICATIONS:                                                                                                                      No current facility-administered medications on file prior to  encounter.   Current Outpatient  Medications on File Prior to Encounter  Medication Sig Dispense Refill   cholecalciferol (VITAMIN D3) 25 MCG (1000 UNIT) tablet Take 1,000 Units by mouth daily.     escitalopram (LEXAPRO) 10 MG tablet Take 10 mg by mouth daily.     vitamin C (ASCORBIC ACID) 500 MG tablet Take 500 mg by mouth daily.     zinc gluconate 50 MG tablet Take 50 mg by mouth daily.       ROS:                                                                                                                                       Unable to obtain due to apparent mutism.    Blood pressure 129/77, pulse 61, temperature 98.2 F (36.8 C), temperature source Oral, resp. rate 13, height 5\' 10"  (1.778 m), weight (!) 137.2 kg, SpO2 93 %.   General Examination:                                                                                                       Physical Exam  HEENT-  /AT   Lungs- Respirations unlabored Extremities- Venous stasis skin texture and pigmentary changes to BLE below knees.   Neurological Examination Mental Status: Awake and alert with eyes opened but nonverbal. Does follow simple commands such as wiggling toes and gripping examiners' hands. . Cranial Nerves: II: Blinks to threat in bilateral temporal visual fields. PERRL.  III,IV, VI: Tracks to the left and right without difficulty. No ptosis. No nystagmus.  V: Did not react to brow ridge pressure bilaterally.  VII: When asked to smile, edges of mouth quiver and then gradually move upwards a few mm before mouth suddenly returns back to resting position. Movement appears labored and with embellished quality.  VIII: hearing intact to simple commands.  IX,X: Does not open mouth to command XI: Head is midline XII: Does not protrude tongue to command Motor: Squeezed RN and examiner's hands when asked, but with less strength on the right. Will not elevate BUE antigravity - when examiner lifts and coaches pt to  keep elevated, the arms briefly remain elevated then give-way and fall to the patient's chest. In this context, there is inconsistent need by patient for force applied by examiner to keep BUE elevated prior to release.  BLE without movement to plantar stimulation and with decreased tone, but will wiggle toes L>R when asked. His BLE are symmetrically positioned without lateral rotation of either leg Sensory:  Does not react to pinch in upper or lower extremities except for a brief twitch with pinching of right thigh.  Deep Tendon Reflexes: 2+ and symmetric brachioradialis, patellae and achilles bilaterally.   Plantars: Right: downgoing   Left: downgoing Cerebellar: Not following commands for assessment Gait: Deferred Other: After risk of hemorrhage with IV tPA was mentioned to wife via speakerphone, the patient suddenly became verbal and said "no". He was then able to answer all orientation questions with hypophonic speech as well as to give some details of the "blackout" spell he had at home this morning.  NIHSS: 23 prior to becoming verbal    Lab Results: Basic Metabolic Panel: Recent Labs  Lab 04/12/21 1026  NA 139  K 4.6  CL 104  GLUCOSE 100*  BUN 18  CREATININE 1.10    CBC: Recent Labs  Lab 04/12/21 1026 04/12/21 1029  WBC  --  7.4  NEUTROABS  --  4.4  HGB 13.9 14.8  HCT 41.0 42.7  MCV  --  89.7  PLT  --  212    Cardiac Enzymes: No results for input(s): CKTOTAL, CKMB, CKMBINDEX, TROPONINI in the last 168 hours.  Lipid Panel: No results for input(s): CHOL, TRIG, HDL, CHOLHDL, VLDL, LDLCALC in the last 168 hours.  Imaging: CT HEAD CODE STROKE WO CONTRAST  Result Date: 04/12/2021 CLINICAL DATA:  Code stroke.  Neuro deficit, acute, stroke suspected EXAM: CT HEAD WITHOUT CONTRAST TECHNIQUE: Contiguous axial images were obtained from the base of the skull through the vertex without intravenous contrast. COMPARISON:  September 07, 2019. FINDINGS: Brain: No evidence of acute  large vascular territory infarction, hemorrhage, hydrocephalus, extra-axial collection or mass lesion/mass effect. Vascular: No hyperdense vessel identified. Skull: No acute fracture. Sinuses/Orbits: Clear visualized sinuses. No acute orbital findings. Other: No mastoid effusions. ASPECTS [facility] Stroke Program Early CT Score) Total score (0-10 with 10 being normal): 10. IMPRESSION: 1. No evidence of acute large vascular territory infarct or acute hemorrhage. ASPECTS is 10. 2. Partially empty sella, which is often a normal anatomic variant but can be associated with idiopathic intracranial hypertension. Code stroke imaging results were communicated on 04/12/2021 at 10:39 am to provider Dr. Otelia Limes via secure text paging. Electronically Signed   By: Feliberto Harts M.D.   On: 04/12/2021 10:42     Assessment: 45 year old male presenting via EMS as a Code Stroke after acute onset of an awake, unresponsive state 1. Initial exam in CT reveals mutism, giveway weakness x 4 without definite asymmetry, eyes open and following commands with hesitancy, requiring coaching. After risk of hemorrhage with IV tPA was mentioned to wife via speakerphone, the patient suddenly became verbal and said "no". He was then able to answer all orientation questions with hypophonic speech as well as to give some details of the "blackout" spell he had at home this morning.  2. 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 Semiology not consistent with a seizure and also would be atypical for stroke.  3. CT head:  No evidence of acute large vascular territory infarct or acute hemorrhage. ASPECTS is 10. Partially empty sella, which is often a normal anatomic variant but can be associated with idiopathic intracranial hypertension.  4. CTA of head and neck: No large vessel occlusion, hemodynamically significant stenosis, or other acute vascular  abnormality  Recommendations: 1. MRI brain 2. Neuro checks 3. Further recommendations following MRI 4. Will need outpatient Neurology follow up  Addendum: -MRI brain  shows no acute abnormality.  -May benefit from Psychology consult to assess possible life stressors -Neurohospitalist service will sign off. Please call if there are additional questions   Electronically signed: Dr. Caryl PinaEric Burnis Kaser 04/12/2021, 11:07 AM

## 2021-04-12 NOTE — ED Provider Notes (Addendum)
[facility] EMERGENCY DEPARTMENT Provider Note   CSN: [id number] Arrival date & time: 04/12/21  1017     History No chief complaint on file.   Roy Snyder is a 45 y.o. male.  The history is provided by the EMS personnel. The history is limited by the condition of the patient. No language interpreter was used.  Neurologic Problem This is a new problem. The current episode started 1 to 2 hours ago. The problem occurs constantly. The problem has not changed since onset.Associated symptoms include headaches. Pertinent negatives include no chest pain, no abdominal pain and no shortness of breath. Nothing aggravates the symptoms. Nothing relieves the symptoms. He has tried nothing for the symptoms. The treatment provided no relief.      Past Medical History:  Diagnosis Date   Anxiety     Patient Active Problem List   Diagnosis Date Noted   Prediabetes 09/20/2020   Asymptomatic varicose veins of both lower extremities 04/01/2019   Diastasis recti 04/01/2019   Morbid obesity with BMI of 40.0-44.9, adult ([place]) 08/16/2016   Obstructive sleep apnea syndrome 08/16/2016    Past Surgical History:  Procedure Laterality Date   VENOUS ABLATION Bilateral        No family history on file.  Social History   Tobacco Use   Smoking status: Never   Smokeless tobacco: Never  Vaping Use   Vaping Use: Never used  Substance Use Topics   Alcohol use: Not Currently   Drug use: Never    Home Medications Prior to Admission medications   Medication Sig Start Date End Date Taking? Authorizing Provider  cholecalciferol (VITAMIN D3) 25 MCG (1000 UNIT) tablet Take 1,000 Units by mouth daily.    [provider]  escitalopram (LEXAPRO) 10 MG tablet Take 10 mg by mouth daily. 06/05/20   [provider]  vitamin C (ASCORBIC ACID) 500 MG tablet Take 500 mg by mouth daily.    [provider]  zinc gluconate 50 MG tablet Take 50 mg by mouth daily.     [provider]    Allergies    Patient has no known allergies.  Review of Systems   Review of Systems  Unable to perform ROS: Patient nonverbal (complete aphasia)  Respiratory:  Negative for shortness of breath.   Cardiovascular:  Negative for chest pain.  Gastrointestinal:  Negative for abdominal pain.  Neurological:  Positive for speech difficulty, weakness, numbness and headaches.   Physical Exam Updated Vital Signs BP 120/84   Pulse 65   Temp 98.2 F (36.8 C) (Oral)   Resp 15   Ht _0  (1.778 m)   Wt (!) 137.2 kg   SpO2 98%   BMI 43.40 kg/m   Physical Exam Vitals and nursing note reviewed.  Constitutional:      General: He is not in acute distress.    Appearance: He is well-developed. He is not ill-appearing, toxic-appearing or diaphoretic.  HENT:     Head: Normocephalic and atraumatic.     Nose: Nose normal.     Mouth/Throat:     Mouth: Mucous membranes are moist.  Eyes:     Extraocular Movements: Extraocular movements intact.     Conjunctiva/sclera: Conjunctivae normal.     Pupils: Pupils are equal, round, and reactive to light.  Cardiovascular:     Rate and Rhythm: Normal rate and regular rhythm.     Pulses: Normal pulses.     Heart sounds: No murmur heard. Pulmonary:  Effort: Pulmonary effort is normal. No respiratory distress.     Breath sounds: Normal breath sounds. No wheezing, rhonchi or rales.  Chest:     Chest wall: No tenderness.  Abdominal:     Palpations: Abdomen is soft.     Tenderness: There is no abdominal tenderness. There is no guarding or rebound.  Musculoskeletal:     Cervical back: Neck supple.  Skin:    General: Skin is warm and dry.  Neurological:     Mental Status: He is alert.     Cranial Nerves: Facial asymmetry present.     Sensory: Sensory deficit present.     Motor: Weakness and abnormal muscle tone present. No seizure activity.     Comments: Weakness in both arms and both legs with right worse than left.   Also subtle right facial droop and complete aphasia.   Gait deferred as he is unable to move legs well.  Also could not do finger-nose-finger testing due to weakness.    ED Results / Procedures / Treatments   Labs (all labs ordered are listed, but only abnormal results are displayed) Labs Reviewed  COMPREHENSIVE METABOLIC PANEL - Abnormal; Notable for the following components:      Result Value   Calcium 8.7 (*)    Total Bilirubin 1.3 (*)    All other components within normal limits  I-STAT CHEM 8, ED - Abnormal; Notable for the following components:   Glucose, Bld 100 (*)    Calcium, Ion 1.12 (*)    All other components within normal limits  CBC  DIFFERENTIAL  CBG MONITORING, ED  CBG MONITORING, ED    EKG EKG Interpretation  Date/Time:  Thursday April 12 2021 11:07:40 EDT Ventricular Rate:  66 PR Interval:  158 QRS Duration: 98 QT Interval:  415 QTC Calculation: 435 R Axis:   -46 Text Interpretation: Sinus rhythm Inferior infarct, old No prior ECG for comparison. No STEMI Confirmed by Antony Blackbird [phone]) on 04/12/2021 1:03:30 PM  Radiology MR BRAIN WO CONTRAST  Result Date: 04/12/2021 CLINICAL DATA:  Neuro deficit, acute, stroke suspected. Additional provided: Acute onset of an awake unresponsive state. EXAM: MRI HEAD WITHOUT CONTRAST TECHNIQUE: Multiplanar, multiecho pulse sequences of the brain and surrounding structures were obtained without intravenous contrast. COMPARISON:  Noncontrast head CT and CT angiogram head/neck performed earlier today 04/12/2021. CT angiogram head/neck 09/27/2019. FINDINGS: Brain: Mild intermittent motion degradation. Cerebral volume is normal. No cortical encephalomalacia is identified. No significant cerebral white matter disease. There is no acute infarct. No evidence of an intracranial mass. No chronic intracranial blood products. No extra-axial fluid collection. No midline shift. Partially empty sella turcica. Vascular: Maintained flow  voids within the proximal large arterial vessels. Skull and upper cervical spine: No focal suspicious marrow lesion. Sinuses/Orbits: Visualized orbits show no acute finding. Trace bilateral ethmoid sinus mucosal thickening. IMPRESSION: Mildly motion degraded exam. No evidence of acute intracranial abnormality. Partially empty sella turcica. This finding is very commonly incidental, but can be associated with idiopathic intracranial hypertension. Otherwise unremarkable non-contrast MRI appearance of the brain. Electronically Signed   By: Kellie Simmering D.O.   On: 04/12/2021 15:32   CT HEAD CODE STROKE WO CONTRAST  Result Date: 04/12/2021 CLINICAL DATA:  Code stroke.  Neuro deficit, acute, stroke suspected EXAM: CT HEAD WITHOUT CONTRAST TECHNIQUE: Contiguous axial images were obtained from the base of the skull through the vertex without intravenous contrast. COMPARISON:  September 07, 2019. FINDINGS: Brain: No evidence of acute large vascular territory infarction,  hemorrhage, hydrocephalus, extra-axial collection or mass lesion/mass effect. Vascular: No hyperdense vessel identified. Skull: No acute fracture. Sinuses/Orbits: Clear visualized sinuses. No acute orbital findings. Other: No mastoid effusions. ASPECTS [facility] Stroke Program Early CT Score) Total score (0-10 with 10 being normal): 10. IMPRESSION: 1. No evidence of acute large vascular territory infarct or acute hemorrhage. ASPECTS is 10. 2. Partially empty sella, which is often a normal anatomic variant but can be associated with idiopathic intracranial hypertension. Code stroke imaging results were communicated on 04/12/2021 at 10:39 am to provider Dr. Cheral Marker via secure text paging. Electronically Signed   By: Margaretha Sheffield M.D.   On: 04/12/2021 10:42   CT ANGIO HEAD NECK W WO CM (CODE STROKE)  Result Date: 04/12/2021 CLINICAL DATA:  Stroke code, right-sided weakness EXAM: CT ANGIOGRAPHY HEAD AND NECK TECHNIQUE: Multidetector CT imaging of the head  and neck was performed using the standard protocol during bolus administration of intravenous contrast. Multiplanar CT image reconstructions and MIPs were obtained to evaluate the vascular anatomy. Carotid stenosis measurements (when applicable) are obtained utilizing NASCET criteria, using the distal internal carotid diameter as the denominator. CONTRAST:  52mL OMNIPAQUE IOHEXOL 350 MG/ML SOLN COMPARISON:  CT head 04/12/2021, CTA head 09/27/2019 FINDINGS: CT HEAD FINDINGS Brain: No evidence of acute infarction, hemorrhage, cerebral edema, mass, mass effect, or midline shift. Ventricles and sulci are normal for age. No extra-axial fluid collection. Partial empty sella. Vascular: No hyperdense vessel or unexpected calcification. Skull: Normal. Negative for fracture or focal lesion. Sinuses/Orbits: No acute finding. Other: The mastoid air cells are well aerated. Review of the MIP images confirms the above findings CTA NECK FINDINGS Aortic arch: Imaged portion shows no evidence of aneurysm or dissection. No significant stenosis of the major arch vessel origins. Right carotid system: No evidence of dissection, stenosis (50% or greater) or occlusion. Left carotid system: No evidence of dissection, stenosis (50% or greater) or occlusion. Vertebral arteries: Right dominant vertebral system. No evidence of dissection, stenosis (50% or greater) or occlusion. Skeleton: No acute osseous abnormality. Other neck: No soft tissue abnormality within the neck. No mass lesion or adenopathy. Upper chest: Negative. Review of the MIP images confirms the above findings CTA HEAD FINDINGS Anterior circulation: Both internal carotid arteries are widely patent to the termini, without stenosis or other abnormality. A1 segments patent. Normal anterior communicating artery. Anterior cerebral arteries widely patent to their distal aspects. No M1 stenosis or occlusion. Normal MCA bifurcations. Distal MCA branches well perfused and symmetric.  Posterior circulation: Vertebral arteries widely patent to the vertebrobasilar junction without stenosis. Posterior inferior cerebral arteries patent bilaterally. Basilar patent to its distal aspect. Superior cerebral arteries patent bilaterally. PCAs well perfused to their distal aspects without stenosis. Venous sinuses: As permitted by contrast timing, patent. Anatomic variants: Fetal origin of the right PCA. Near fetal origin of the left PCA. Review of the MIP images confirms the above findings IMPRESSION: No large vessel occlusion, hemodynamically significant stenosis, or other acute vascular abnormality. Electronically Signed   By: Merilyn Baba M.D.   On: 04/12/2021 11:13    Procedures Procedures   Medications Ordered in ED Medications  sodium chloride flush (NS) 0.9 % injection 3 mL (has no administration in time range)  iohexol (OMNIPAQUE) 350 MG/ML injection 60 mL (60 mLs Intravenous Contrast Given 04/12/21 1055)    ED Course  I have reviewed the triage vital signs and the nursing notes.  Pertinent labs & imaging results that were available during my care of the patient were  reviewed by me and considered in my medical decision making (see chart for details).    MDM Rules/Calculators/A&P                           Delance Weide is a 45 y.o. male with a past medical history significant for anxiety, obesity, and varicose veins of the lower extremities who presents as a code stroke.  According to EMS, patient was working out and at 9:15 AM, approximate 1 hour prior to arrival, patient had sudden onset of headache and went in after work-up to go see family when he subsequently collapsed.  Unknown if there was loss of consciousness.  Patient is reporting via nodding that his headache has improved but since the onset he has had complete aphasia and is not able to say anything.  He also has weakness in both arms and both legs with right worse than left.  He also is reporting numbness in the right  face, right arm, and right leg with some subtle right facial droop.  Unknown history of any neurologic complaints.  On arrival, airway is intact.  Breath sounds equal bilaterally and patient was quickly taken to CT scanner for evaluation.  Neurology met the patient in the [place].  On my exam, patient does indeed have subtle right facial droop, and weakness in the right arm and right leg worse than the left side.  Left side is however weak compared to his reported baseline as he was exercising.  Lungs were clear and chest was nontender.  Abdomen was nontender.  Patient has intact extraocular movements and pupils are symmetric and reactive.  Clinical I am concerned about something intracranial causing his symptoms.  With the eye movement being intact strength and everything else being somewhat weak, I am concerned about possible locked-in type presentation versus a left-sided MCA stroke causing the speech and worsened right-sided symptoms.    We will get the code stroke CT and then likely CTA following.  Will await neurology recommendations for further management.  Initial CT and CTA were reassuring however neurology feels that MRI is appropriate to further rule out stroke.  Depending on what MRI shows, patient will either need admission versus be able to be discharged home if he is able to have improvement in symptoms and able to ambulate again.  Anticipate reassessment after MRI.  4:12 PM MRI returned showing no acute stroke or tumor.  Does show likely incidental empty sella turcica versus related to intracranial hypertension.  On my reassessment, patient is still barely move his extremities and does not talking.  Due to his inability to safely move, do not feel safe for discharge home.  It is still unclear the etiology of his symptoms but will call for admission for further work-up and management.    Will let neurology know of his reassessment and plans for admission.  Patient will be  admitted.  4:28 PM Spoke with Dr. Cheral Marker with neurology who reviewed the case and agrees that he does not suspect this is idiopathic intracranial hypertension but is unsure exactly what is causing patient's symptoms.  He agrees with medical mission for further work-up and likely PT/OT eval.\   Final Clinical Impression(s) / ED Diagnoses Final diagnoses:  Aphasia  Weakness    Clinical Impression: 1. Aphasia   2. Weakness     Disposition: Admit  This note was prepared with assistance of Dragon voice recognition software. Occasional wrong-word or sound-a-like substitutions may  have occurred due to the inherent limitations of voice recognition software.        Saleena Tamas, Gwenyth Allegra, MD 04/12/21 1620    Mehreen Azizi, Gwenyth Allegra, MD 04/12/21 [phone]

## 2021-04-12 NOTE — H&P (Addendum)
History and Physical    Roy Snyder FAO:[id number] DOB: [birth year] DOA: 04/12/2021  PCP: Julianne Handler, NP (Confirm with patient/family/NH records and if not entered, this has to be entered at [facility] point of entry) Patient coming from: Home  I have personally briefly reviewed patient's old medical records in [facility]  Chief Complaint: Patient not talking.  HPI: Roy Snyder is a 45 y.o. male with medical history significant of anxiety/depression, vertigo, sleep apnea came with altered mentation and strokelike symptoms.  Patient remained nonverbal throughout ER stay, most history provided by patient wife at bedside.  Wife reported that patient went out with his son to gym this morning for weight lifting from 7 to 8 AM and came back as usual.  However around 930, son heard a loud thump and went to check the patient, and found the patient on the floor with eye opening.  Wife came back in 10 minutes and patient remained on the floor with eye opening but nonverbal and not able to move any of the limbs.  EMS came and found patient awake but nonverbal and seemingly weaker on the right side.  Wife reported patient works as a Education officer, environmental at [employer], was diagnosed with anxiety depression last years been on SSRI, and has been following with outpatient psychiatrist.  Konrad Dolores to his wife, patient anxiety depression has been stable no change of medication recently.  Last Tuesday, " patient had a overwhelming stress in his life", but wife reported that patient's behavior seems not affected the whole week.  Over last month, patient has had frequent epigastric pain, and frequent diarrhea. No N/V. Patient was seen by GI as outpatient, and tested positive for H. pylori for which patient completed course of p.o. antibiotics. Patient has been on Bentyl PRN for GI symptoms.  Patient on PPIs and H2 blocker, and scheduled for endoscopy and colonoscopy.  Patient has chronic vertigos, happens 1-2 time a week, but never  before today patient lost balance and falls.  ED Course: MRI negative stroke but incidental finding of partially empty sella turcica which can potentially associated with idiopathic intracranial hypertension.  Neurology aware.  Review of Systems: As per HPI otherwise 14 point review of systems negative.    Past Medical History:  Diagnosis Date   Anxiety    Sleep apnea     Past Surgical History:  Procedure Laterality Date   VENOUS ABLATION Bilateral      reports that he has never smoked. He has never used smokeless tobacco. He reports that he does not currently use alcohol. He reports that he does not use drugs.  No Known Allergies  No family history on file.   Prior to Admission medications   Medication Sig Start Date End Date Taking? Authorizing Provider  cholecalciferol (VITAMIN D3) 25 MCG (1000 UNIT) tablet Take 1,000 Units by mouth daily.   Yes [provider]  dicyclomine (BENTYL) 20 MG tablet Take 20 mg by mouth as directed. Take 1 tablet BID AS WELL AS WHEN EATING A MEAL 03/29/21  Yes [provider]  escitalopram (LEXAPRO) 10 MG tablet Take 10 mg by mouth at bedtime. 06/05/20  Yes [provider]  famotidine (PEPCID) 20 MG tablet Take 20 mg by mouth 2 (two) times daily. 03/29/21  Yes [provider]  fluticasone (FLONASE) 50 MCG/ACT nasal spray Place 1 spray into both nostrils daily.   Yes [provider]  loperamide (IMODIUM) 2 MG capsule Take 2 mg by mouth 2 (two) times daily  as needed for diarrhea or loose stools. 03/29/21  Yes [provider]  ondansetron (ZOFRAN-ODT) 4 MG disintegrating tablet Take 4 mg by mouth every 8 (eight) hours as needed for nausea or vomiting. 12/08/20  Yes [provider]  pantoprazole (PROTONIX) 40 MG tablet Take 40 mg by mouth daily. 03/13/21  Yes [provider]  vitamin C (ASCORBIC ACID) 500 MG tablet Take 500 mg by mouth daily.   Yes [provider]  zinc gluconate  50 MG tablet Take 50 mg by mouth daily.   Yes [provider]    Physical Exam: Vitals:   04/12/21 1330 04/12/21 1415 04/12/21 1555 04/12/21 1600  BP: (!) 131/92 138/90 117/76 120/84  Pulse: 65 69 66 65  Resp: 17 20 15 15   Temp:      TempSrc:      SpO2: 97% 97% 98% 98%  Weight:      Height:        Constitutional: NAD, calm, comfortable Vitals:   04/12/21 1330 04/12/21 1415 04/12/21 1555 04/12/21 1600  BP: (!) 131/92 138/90 117/76 120/84  Pulse: 65 69 66 65  Resp: 17 20 15 15   Temp:      TempSrc:      SpO2: 97% 97% 98% 98%  Weight:      Height:       Eyes: PERRL, lids and conjunctivae normal ENMT: Mucous membranes are moist. Posterior pharynx clear of any exudate or lesions.Normal dentition.  Neck: normal, supple, no masses, no thyromegaly Respiratory: clear to auscultation bilaterally, no wheezing, no crackles. Normal respiratory effort. No accessory muscle use.  Cardiovascular: Regular rate and rhythm, no murmurs / rubs / gallops. No extremity edema. 2+ pedal pulses. No carotid bruits.  Abdomen: no tenderness, no masses palpated. No hepatosplenomegaly. Bowel sounds positive.  Musculoskeletal: no clubbing / cyanosis. No joint deformity upper and lower extremities. Good ROM, no contractures. Normal muscle tone.  Skin: no rashes, lesions, ulcers. No induration Neurologic: CN 2-12 grossly intact. Sensation intact, DTR normal. Strength 3/5 in right arm and leg compared to 5/5 on left side. Psychiatric: Awake, not answering questions.     Labs on Admission: I have personally reviewed following labs and imaging studies  CBC: Recent Labs  Lab 04/12/21 1026 04/12/21 1029  WBC  --  7.4  NEUTROABS  --  4.4  HGB 13.9 14.8  HCT 41.0 42.7  MCV  --  89.7  PLT  --  212   Basic Metabolic Panel: Recent Labs  Lab 04/12/21 1026 04/12/21 1029  NA 139 136  K 4.6 4.4  CL 104 104  CO2  --  27  GLUCOSE 100* 99  BUN 18 13  CREATININE 1.10 1.04  CALCIUM  --  8.7*    GFR: Estimated Creatinine Clearance: 127.9 mL/min (by C-G formula based on SCr of 1.04 mg/dL). Liver Function Tests: Recent Labs  Lab 04/12/21 1029  AST 33  ALT 29  ALKPHOS 69  BILITOT 1.3*  PROT 6.8  ALBUMIN 3.8   No results for input(s): LIPASE, AMYLASE in the last 168 hours. No results for input(s): AMMONIA in the last 168 hours. Coagulation Profile: No results for input(s): INR, PROTIME in the last 168 hours. Cardiac Enzymes: No results for input(s): CKTOTAL, CKMB, CKMBINDEX, TROPONINI in the last 168 hours. BNP (last 3 results) No results for input(s): PROBNP in the last 8760 hours. HbA1C: No results for input(s): HGBA1C in the last 72 hours. CBG: Recent Labs  Lab 04/12/21 1023 04/12/21  1057  GLUCAP 94 83   Lipid Profile: No results for input(s): CHOL, HDL, LDLCALC, TRIG, CHOLHDL, LDLDIRECT in the last 72 hours. Thyroid Function Tests: No results for input(s): TSH, T4TOTAL, FREET4, T3FREE, THYROIDAB in the last 72 hours. Anemia Panel: No results for input(s): VITAMINB12, FOLATE, FERRITIN, TIBC, IRON, RETICCTPCT in the last 72 hours. Urine analysis: No results found for: COLORURINE, APPEARANCEUR, LABSPEC, PHURINE, GLUCOSEU, HGBUR, BILIRUBINUR, KETONESUR, PROTEINUR, UROBILINOGEN, NITRITE, LEUKOCYTESUR  Radiological Exams on Admission: MR BRAIN WO CONTRAST  Result Date: 04/12/2021 CLINICAL DATA:  Neuro deficit, acute, stroke suspected. Additional provided: Acute onset of an awake unresponsive state. EXAM: MRI HEAD WITHOUT CONTRAST TECHNIQUE: Multiplanar, multiecho pulse sequences of the brain and surrounding structures were obtained without intravenous contrast. COMPARISON:  Noncontrast head CT and CT angiogram head/neck performed earlier today 04/12/2021. CT angiogram head/neck 09/27/2019. FINDINGS: Brain: Mild intermittent motion degradation. Cerebral volume is normal. No cortical encephalomalacia is identified. No significant cerebral white matter disease. There is  no acute infarct. No evidence of an intracranial mass. No chronic intracranial blood products. No extra-axial fluid collection. No midline shift. Partially empty sella turcica. Vascular: Maintained flow voids within the proximal large arterial vessels. Skull and upper cervical spine: No focal suspicious marrow lesion. Sinuses/Orbits: Visualized orbits show no acute finding. Trace bilateral ethmoid sinus mucosal thickening. IMPRESSION: Mildly motion degraded exam. No evidence of acute intracranial abnormality. Partially empty sella turcica. This finding is very commonly incidental, but can be associated with idiopathic intracranial hypertension. Otherwise unremarkable non-contrast MRI appearance of the brain. Electronically Signed   By: Jackey Loge D.O.   On: 04/12/2021 15:32   CT HEAD CODE STROKE WO CONTRAST  Result Date: 04/12/2021 CLINICAL DATA:  Code stroke.  Neuro deficit, acute, stroke suspected EXAM: CT HEAD WITHOUT CONTRAST TECHNIQUE: Contiguous axial images were obtained from the base of the skull through the vertex without intravenous contrast. COMPARISON:  September 07, 2019. FINDINGS: Brain: No evidence of acute large vascular territory infarction, hemorrhage, hydrocephalus, extra-axial collection or mass lesion/mass effect. Vascular: No hyperdense vessel identified. Skull: No acute fracture. Sinuses/Orbits: Clear visualized sinuses. No acute orbital findings. Other: No mastoid effusions. ASPECTS [facility] Stroke Program Early CT Score) Total score (0-10 with 10 being normal): 10. IMPRESSION: 1. No evidence of acute large vascular territory infarct or acute hemorrhage. ASPECTS is 10. 2. Partially empty sella, which is often a normal anatomic variant but can be associated with idiopathic intracranial hypertension. Code stroke imaging results were communicated on 04/12/2021 at 10:39 am to provider Dr. Otelia Limes via secure text paging. Electronically Signed   By: Feliberto Harts M.D.   On: 04/12/2021 10:42    CT ANGIO HEAD NECK W WO CM (CODE STROKE)  Result Date: 04/12/2021 CLINICAL DATA:  Stroke code, right-sided weakness EXAM: CT ANGIOGRAPHY HEAD AND NECK TECHNIQUE: Multidetector CT imaging of the head and neck was performed using the standard protocol during bolus administration of intravenous contrast. Multiplanar CT image reconstructions and MIPs were obtained to evaluate the vascular anatomy. Carotid stenosis measurements (when applicable) are obtained utilizing NASCET criteria, using the distal internal carotid diameter as the denominator. CONTRAST:  47mL OMNIPAQUE IOHEXOL 350 MG/ML SOLN COMPARISON:  CT head 04/12/2021, CTA head 09/27/2019 FINDINGS: CT HEAD FINDINGS Brain: No evidence of acute infarction, hemorrhage, cerebral edema, mass, mass effect, or midline shift. Ventricles and sulci are normal for age. No extra-axial fluid collection. Partial empty sella. Vascular: No hyperdense vessel or unexpected calcification. Skull: Normal. Negative for fracture or focal lesion. Sinuses/Orbits: No acute finding.  Other: The mastoid air cells are well aerated. Review of the MIP images confirms the above findings CTA NECK FINDINGS Aortic arch: Imaged portion shows no evidence of aneurysm or dissection. No significant stenosis of the major arch vessel origins. Right carotid system: No evidence of dissection, stenosis (50% or greater) or occlusion. Left carotid system: No evidence of dissection, stenosis (50% or greater) or occlusion. Vertebral arteries: Right dominant vertebral system. No evidence of dissection, stenosis (50% or greater) or occlusion. Skeleton: No acute osseous abnormality. Other neck: No soft tissue abnormality within the neck. No mass lesion or adenopathy. Upper chest: Negative. Review of the MIP images confirms the above findings CTA HEAD FINDINGS Anterior circulation: Both internal carotid arteries are widely patent to the termini, without stenosis or other abnormality. A1 segments patent. Normal  anterior communicating artery. Anterior cerebral arteries widely patent to their distal aspects. No M1 stenosis or occlusion. Normal MCA bifurcations. Distal MCA branches well perfused and symmetric. Posterior circulation: Vertebral arteries widely patent to the vertebrobasilar junction without stenosis. Posterior inferior cerebral arteries patent bilaterally. Basilar patent to its distal aspect. Superior cerebral arteries patent bilaterally. PCAs well perfused to their distal aspects without stenosis. Venous sinuses: As permitted by contrast timing, patent. Anatomic variants: Fetal origin of the right PCA. Near fetal origin of the left PCA. Review of the MIP images confirms the above findings IMPRESSION: No large vessel occlusion, hemodynamically significant stenosis, or other acute vascular abnormality. Electronically Signed   By: Wiliam KeAlison  Vasan M.D.   On: 04/12/2021 11:13    EKG: Independently reviewed.  Sinus rhythm, no PR or QTc interval changes.  Assessment/Plan Active Problems:   TIA (transient ischemic attack)  (please populate well all problems here in Problem List. (For example, if patient is on BP meds at home and you resume or decide to hold them, it is a problem that needs to be her. Same for CAD, COPD, HLD and so on)  Right sided paresis and aphasia -Stroked ruled out. -Clinically, no significant decrease of light touch sensation on right side, due to his body habitus, heart reinduced knee-jerk, or heel reflux.  -ASA 81 mg, check A1C and lipid panel. Echo, and tele monitoring -If worse stroke work-up negative, should consider psychiatry reasons such as conversion disorder/somatization.  Discussed this possibility with wife at bedside, all questions answered and my best knowledge.  We also discussed about possibility of concussion when he fell can cause neurology symptoms mimicking stroke but expect symptoms resolved within 24 hours. -UDS.  Question of syncope -No significant history of  arrhythmia, or hypotension.  Check orthostatic vital signs, telemetry monitoring, echo cardiogram.  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  IBS -Continue Bentyl, PPI and Imodium for now, outpatient GI follow-up for colonoscopy and endoscopy.  Morbid obesity -Outpatient GI follow-up.  DVT prophylaxis: Lovenox Code Status: Full code Family Communication: Wife at bedside Disposition Plan: Expect less than 2 midnight hospital stay Consults called: Neurology Admission status: Telemetry observation   Emeline GeneralPing T Abayomi Pattison MD [facility] Pager [phone]  04/12/2021, 5:23 PM

## 2021-04-12 NOTE — ED Triage Notes (Addendum)
Pt arrived via [facility] as a code stroke. Pt LKW 0915 today. Pt was lifting weights then had a sudden onset of a HA. Per EMS pt was aphasic, weakness all over, but worse on the entire right side and numbness on right side.  Per EMS pt was talking to son then collapsed down, but son denies LOC. When pt first arrived he wasn't talking or following commands, but in the CT scanner he started talking and following commands and is A&Ox4. VSS

## 2021-04-13 ENCOUNTER — Encounter (HOSPITAL_COMMUNITY): Payer: Self-pay | Admitting: Internal Medicine

## 2021-04-13 ENCOUNTER — Observation Stay (HOSPITAL_BASED_OUTPATIENT_CLINIC_OR_DEPARTMENT_OTHER): Payer: 59

## 2021-04-13 ENCOUNTER — Observation Stay (HOSPITAL_COMMUNITY): Payer: 59

## 2021-04-13 DIAGNOSIS — R419 Unspecified symptoms and signs involving cognitive functions and awareness: Secondary | ICD-10-CM | POA: Diagnosis not present

## 2021-04-13 DIAGNOSIS — G4482 Headache associated with sexual activity: Secondary | ICD-10-CM | POA: Diagnosis not present

## 2021-04-13 DIAGNOSIS — G459 Transient cerebral ischemic attack, unspecified: Secondary | ICD-10-CM

## 2021-04-13 DIAGNOSIS — R4701 Aphasia: Secondary | ICD-10-CM | POA: Diagnosis not present

## 2021-04-13 LAB — ECHOCARDIOGRAM COMPLETE
AR max vel: 3.84 cm2
AV Area VTI: 3.79 cm2
AV Area mean vel: 3.62 cm2
AV Mean grad: 4 mmHg
AV Peak grad: 7 mmHg
Ao pk vel: 1.32 m/s
Area-P 1/2: 3.6 cm2
Calc EF: 53.1 %
Height: 70 in
S' Lateral: 3.7 cm
Single Plane A2C EF: 56.2 %
Single Plane A4C EF: 47.1 %
Weight: 4839.54 oz

## 2021-04-13 LAB — LIPID PANEL
Cholesterol: 170 mg/dL (ref 0–200)
HDL: 34 mg/dL — ABNORMAL LOW (ref >40)
LDL Cholesterol: 99 mg/dL (ref 0–99)
Total CHOL/HDL Ratio: 5 RATIO
Triglycerides: 186 mg/dL — ABNORMAL HIGH (ref <150)
VLDL: 37 mg/dL (ref 0–40)

## 2021-04-13 LAB — HEMOGLOBIN A1C
Hgb A1c MFr Bld: 4.3 % — ABNORMAL LOW (ref 4.8–5.6)
Mean Plasma Glucose: 76.71 mg/dL

## 2021-04-13 IMAGING — CR DG SHOULDER 2+V*R*
4 series · 4 of 4 positions shown · non-contrast
Comparison: None.

CLINICAL DATA: Shoulder pain after falling last night.

EXAM:
RIGHT SHOULDER - 2+ VIEW

[shoulder y view]
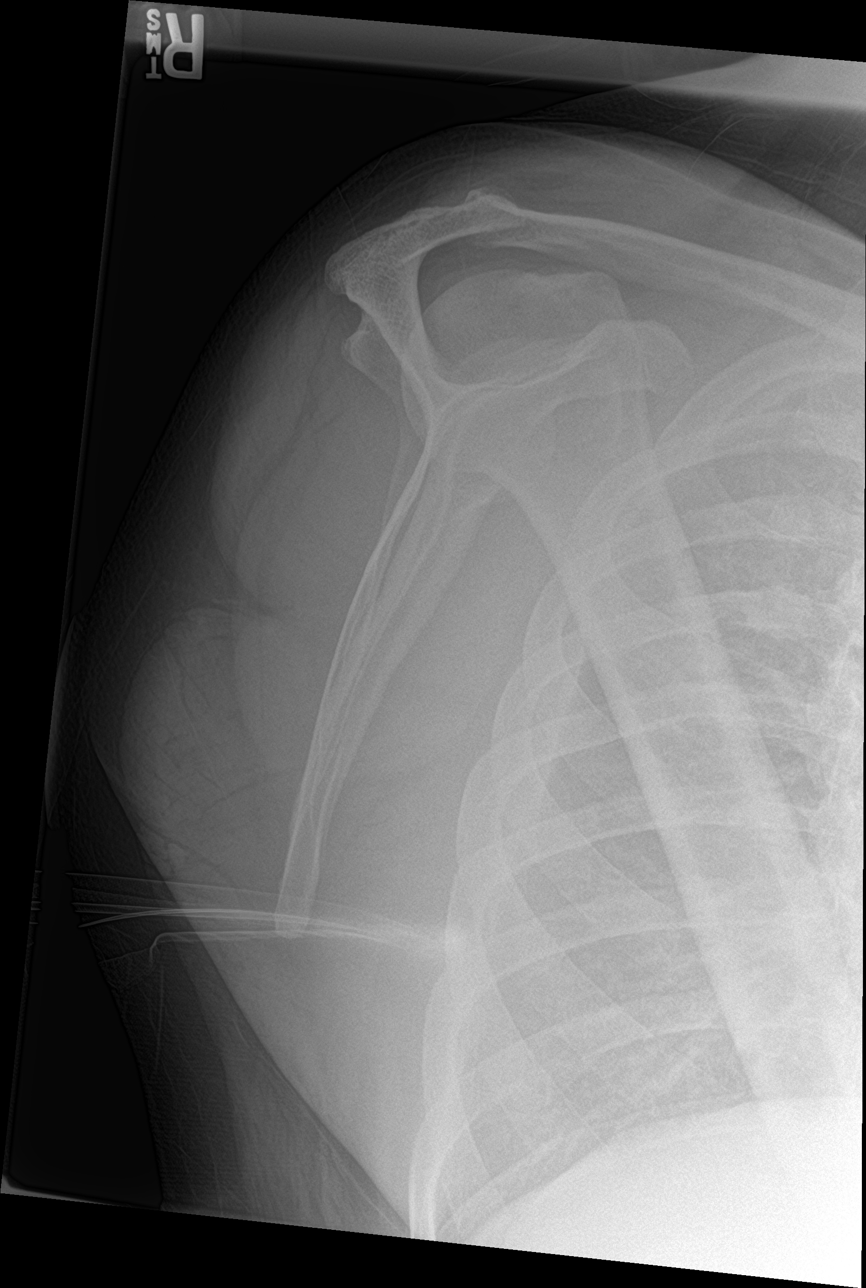

[shoulder grashey (1 of 2)]
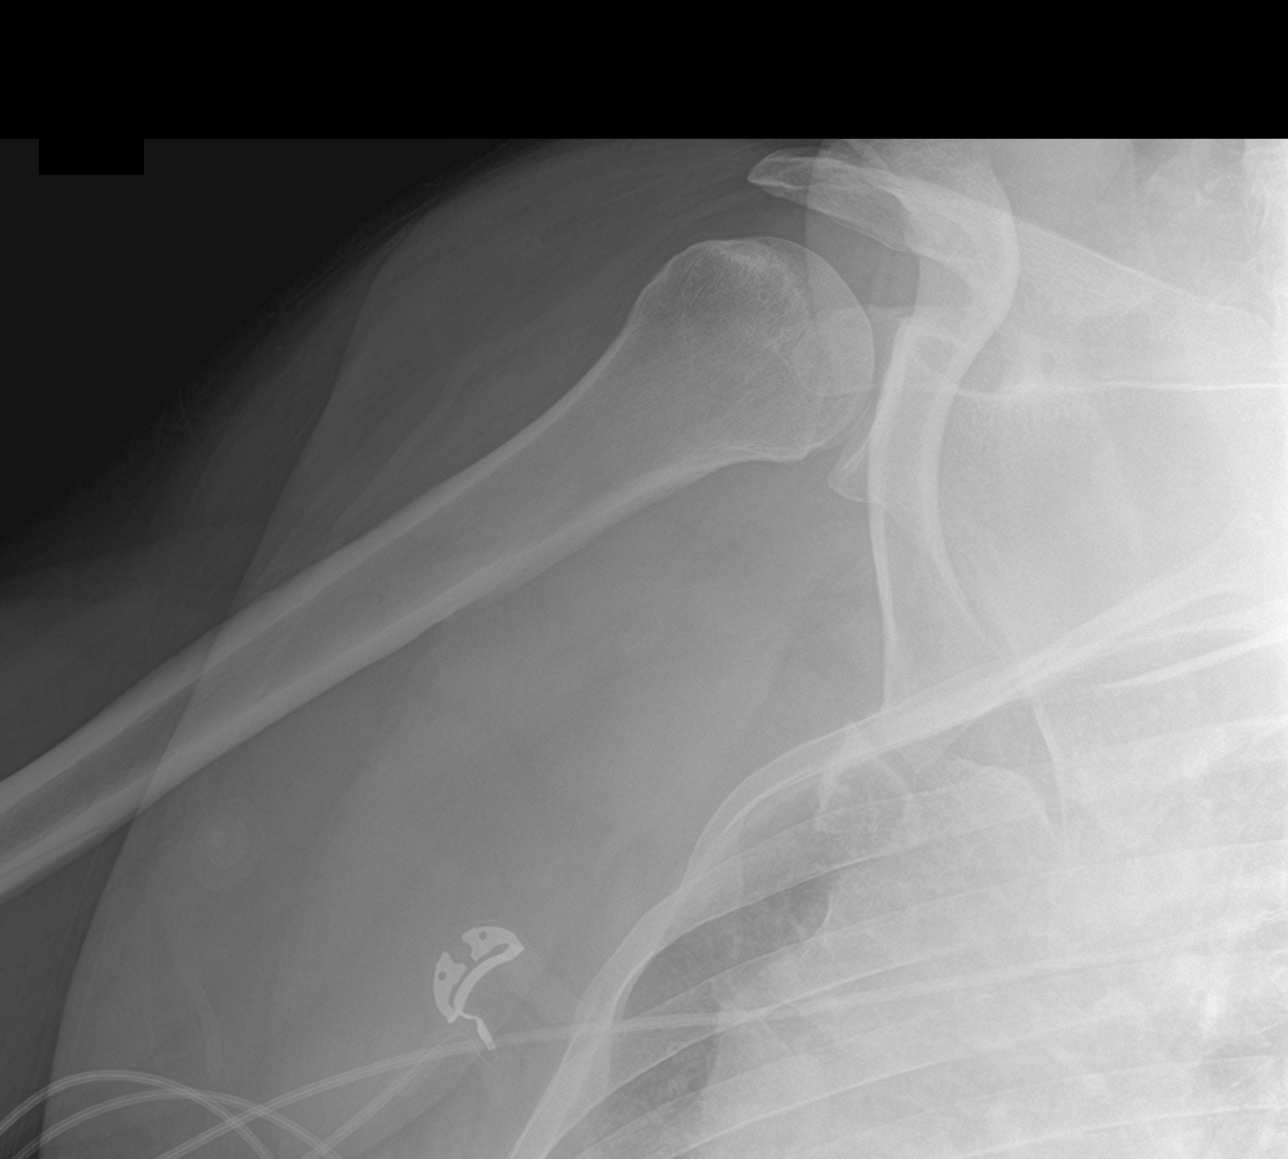

[shoulder grashey (2 of 2)]
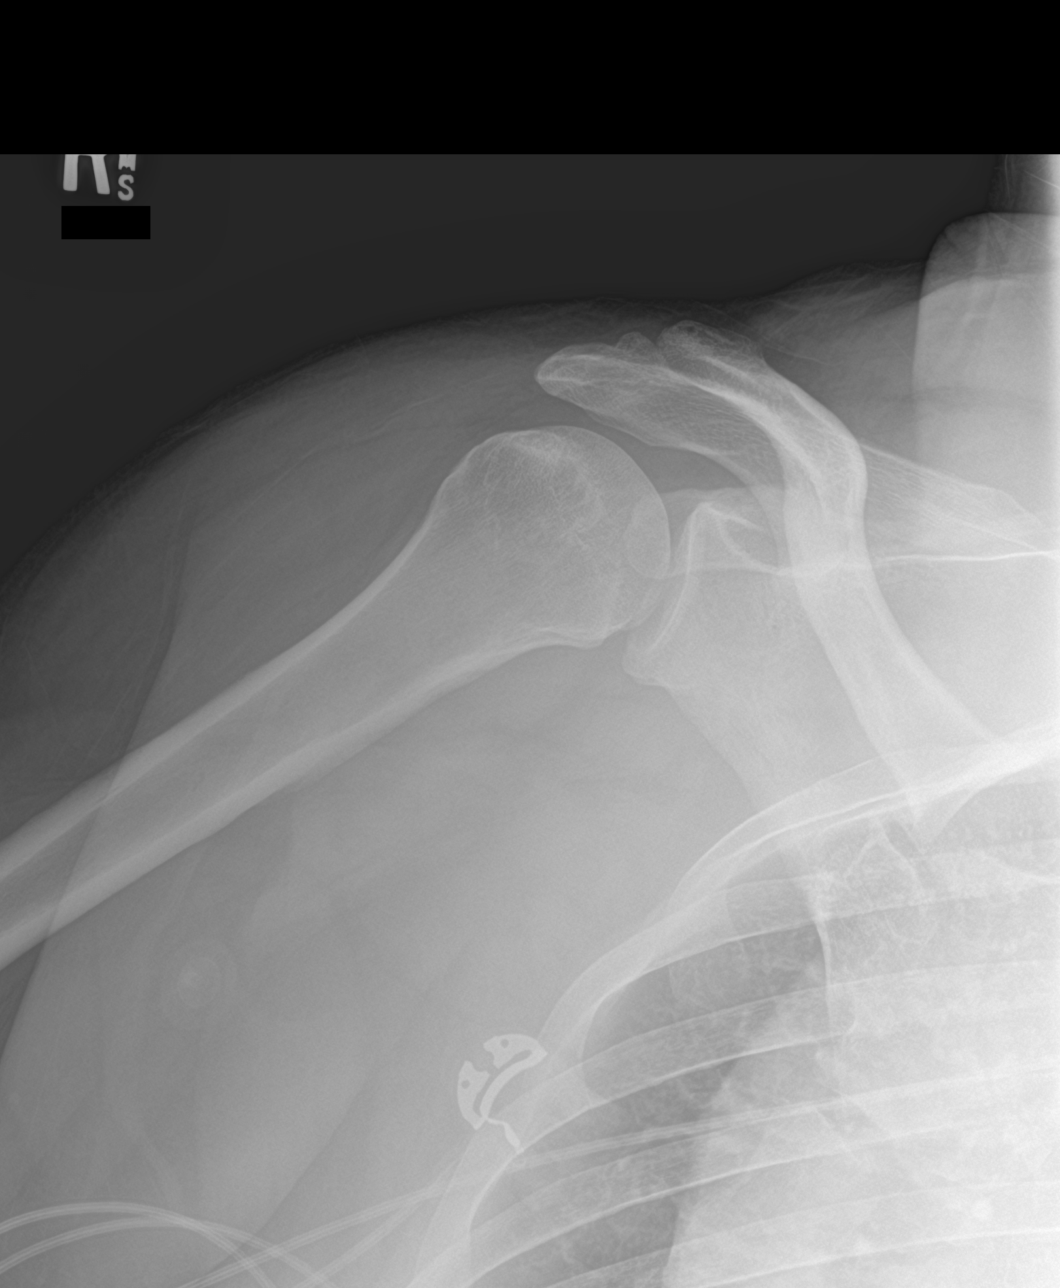

[shoulder axillary]
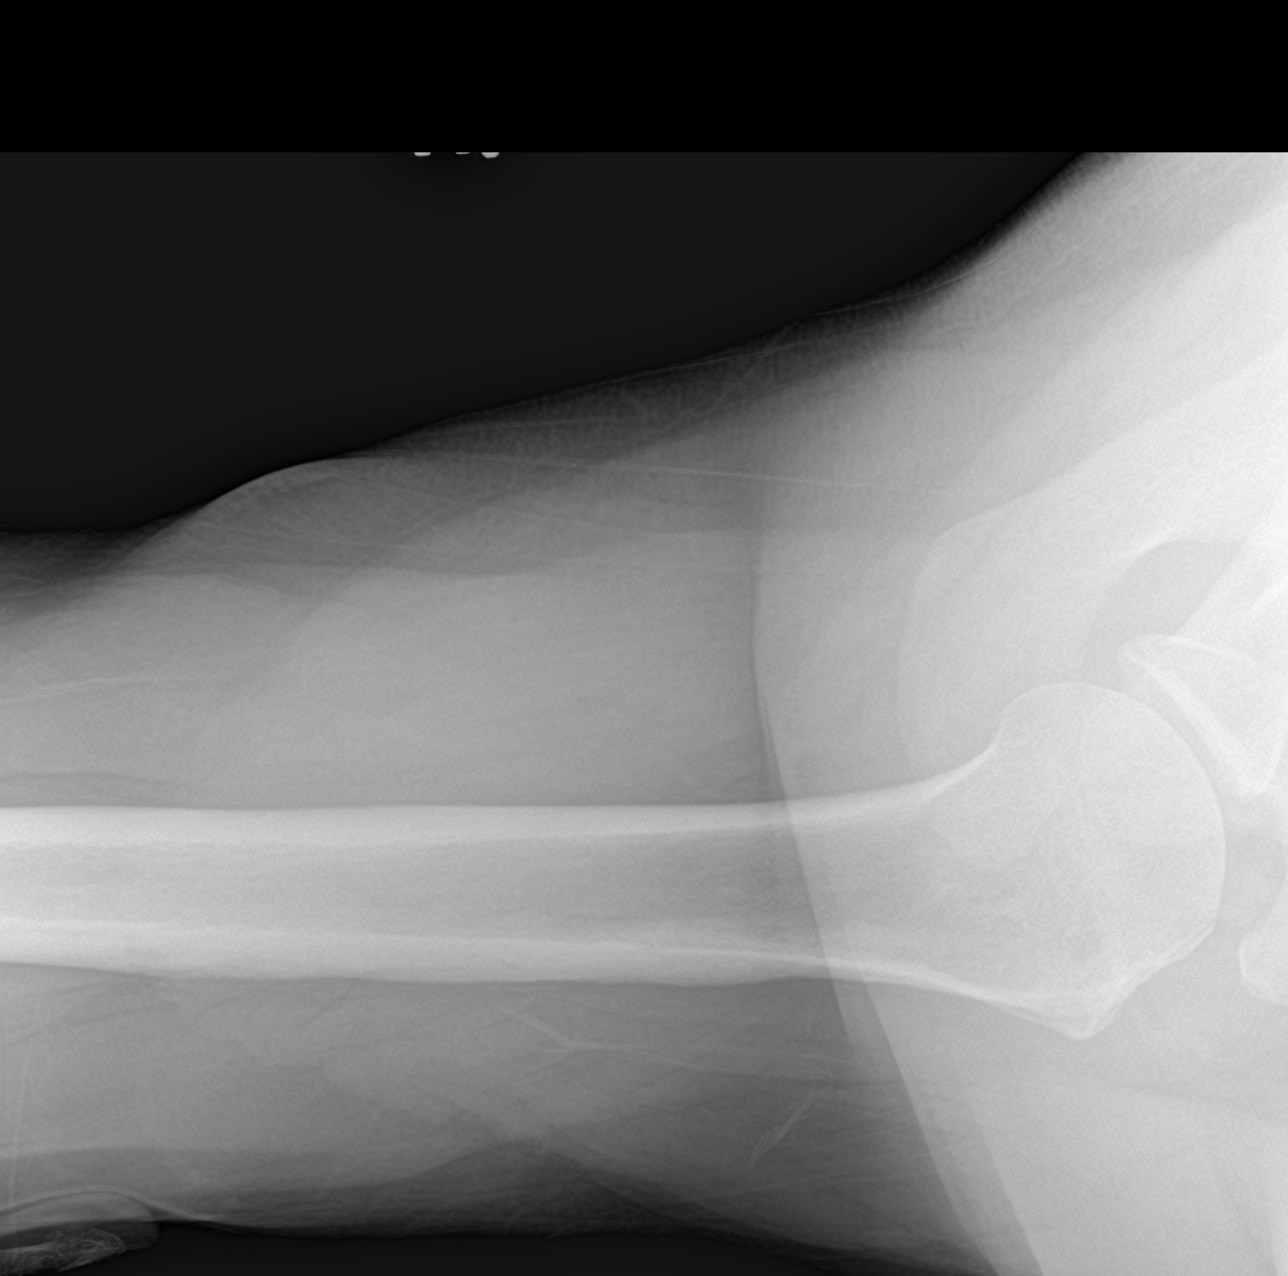

[4 of 4 positions shown; findings below may reference images not displayed]

FINDINGS: No acute fracture or dislocation. Degenerative changes involve the
acromioclavicular joint.
IMPRESSION: No acute osseous abnormality.

## 2021-04-13 MED ORDER — ADULT MULTIVITAMIN W/MINERALS CH: 1.0000 | ORAL_TABLET | Freq: Every day | ORAL | Status: DC

## 2021-04-13 NOTE — Progress Notes (Signed)
EEG complete - results pending 

## 2021-04-13 NOTE — Discharge Summary (Signed)
Physician Discharge Summary  Roy Snyder ZOX:[id number] DOB: Feb 19, 1976 DOA: 04/12/2021  PCP: Julianne Handler, NP  Admit date: 04/12/2021 Discharge date: 04/13/2021  Admitted From: Home Disposition:  Home  Recommendations for Outpatient Follow-up:  Follow up with PCP in 1-2 weeks Follow up with Neurology as scheduled  Home Health:PT, OT   Discharge Condition:Stable CODE STATUS:Full Diet recommendation: Heart healthy   Brief/Interim Summary: 45 y.o. male with medical history significant of anxiety/depression, vertigo, sleep apnea came with altered mentation and strokelike symptoms  Discharge Diagnoses:  Active Problems:   TIA (transient ischemic attack)  Right sided paresis and aphasia -Stroked ruled out with unremarkable MRI noted -Pt reported feeling much better the following day -Neurology was consulted, appreciate recommendations -Recommendations to follow up closely with Neurology as outpatient -EEG was performed and read, found to be unremarkable as well   Report of syncope, none while in hospital -No significant history of arrhythmia, or hypotension.   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 -Noted by Neurology. Would have pt f/u closely with Neurology as outpatient   IBS -Continue Bentyl, PPI and Imodium for now, outpatient GI follow-up for colonoscopy and endoscopy.   Morbid obesity -Outpatient GI follow-up.    Discharge Instructions  Discharge Instructions     Ambulatory referral to Neurology   Complete by: As directed    An appointment is requested in approximately: 2-3 weeks for HA, ? Atypical vs complicated MHA.      Allergies as of 04/13/2021   No Known Allergies      Medication List     TAKE these medications     cholecalciferol 25 MCG (1000 UNIT) tablet Commonly known as: VITAMIN D3 Take 1,000 Units by mouth daily.   dicyclomine 20 MG tablet Commonly known as: BENTYL Take 20 mg by mouth as directed. Take 1 tablet BID AS WELL AS WHEN EATING A MEAL   escitalopram 10 MG tablet Commonly known as: LEXAPRO Take 10 mg by mouth at bedtime.   famotidine 20 MG tablet Commonly known as: PEPCID Take 20 mg by mouth 2 (two) times daily.   fluticasone 50 MCG/ACT nasal spray Commonly known as: FLONASE Place 1 spray into both nostrils daily.   loperamide 2 MG capsule Commonly known as: IMODIUM Take 2 mg by mouth 2 (two) times daily as needed for diarrhea or loose stools.   ondansetron 4 MG disintegrating tablet Commonly known as: ZOFRAN-ODT Take 4 mg by mouth every 8 (eight) hours as needed for nausea or vomiting.   pantoprazole 40 MG tablet Commonly known as: PROTONIX Take 40 mg by mouth daily.   vitamin C 500 MG tablet Commonly known as: ASCORBIC ACID Take 500 mg by mouth daily.   zinc gluconate 50 MG tablet Take 50 mg by mouth daily.               Durable Medical Equipment  (From admission, onward)           Start     Ordered   04/13/21 1433  For home use only DME 3 n 1  Once        04/13/21 1432   04/13/21 1432  For home use only DME Walker rolling  Once       Question Answer Comment  Walker: With 5 [employer]  Patient needs a walker to treat with the following condition Weakness      04/13/21 1432            Follow-up Information     [facility].   Specialty: Home Health Services Why: The home health agency will contact you for the first home visit Contact information: [address] [place] [address] [phone]         Julianne Handler, NP. Schedule an appointment as soon as possible for a visit in 2 week(s).   Specialty: Nurse Practitioner Why: Hospital follow up Contact information: [address] [place]  [place] [address] [phone]                No Known Allergies  Consultations: Neurology  Procedures/Studies: DG Shoulder Right  Result Date: 04/13/2021 CLINICAL DATA:  Shoulder pain after falling last night. EXAM: RIGHT SHOULDER - 2+ VIEW COMPARISON:  None. FINDINGS: No acute fracture or dislocation. Degenerative changes involve the acromioclavicular joint. IMPRESSION: No acute osseous abnormality. Electronically Signed   By: Jeronimo Greaves M.D.   On: 04/13/2021 11:15   MR BRAIN WO CONTRAST  Result Date: 04/12/2021 CLINICAL DATA:  Neuro deficit, acute, stroke suspected. Additional provided: Acute onset of an awake unresponsive state. EXAM: MRI HEAD WITHOUT CONTRAST TECHNIQUE: Multiplanar, multiecho pulse sequences of the brain and surrounding structures were obtained without intravenous contrast. COMPARISON:  Noncontrast head CT and CT angiogram head/neck performed earlier today 04/12/2021. CT angiogram head/neck 09/27/2019. FINDINGS: Brain: Mild intermittent motion degradation. Cerebral volume is normal. No cortical encephalomalacia is identified. No significant cerebral white matter disease. There is no acute infarct. No evidence of an intracranial mass. No chronic intracranial blood products. No extra-axial fluid collection. No midline shift. Partially empty sella turcica. Vascular: Maintained flow voids within the proximal large arterial vessels. Skull and upper cervical spine: No focal suspicious marrow lesion. Sinuses/Orbits: Visualized orbits show no acute finding. Trace bilateral ethmoid sinus mucosal thickening. IMPRESSION: Mildly motion degraded exam. No evidence of acute intracranial abnormality. Partially empty sella turcica. This finding is very commonly incidental, but can be associated with idiopathic intracranial hypertension. Otherwise unremarkable non-contrast MRI appearance of the brain. Electronically Signed   By: Jackey Loge D.O.   On: 04/12/2021 15:32   ECHOCARDIOGRAM  COMPLETE  Result Date: 04/13/2021    ECHOCARDIOGRAM REPORT   Patient Name:   Roy Snyder Date of Exam: 04/13/2021 Medical Rec #:  [id number]      Height:       70.0 in Accession #:    [id number]     Weight:       302.5 lb Date of Birth:  1975/09/04      BSA:          2.489 m Patient Age:    44 years       BP:           119/90 mmHg Patient Gender: M              HR:           64 bpm. Exam Location:  Inpatient Procedure: 2D Echo, Cardiac Doppler and Color Doppler Indications:    TIA  History:        Patient has no prior history of Echocardiogram examinations.  Sonographer:    Neomia Dear RDCS Referring Phys: [id number] Emeline General IMPRESSIONS  1. Left ventricular ejection fraction, by estimation, is 55 to 60%. The left ventricle has normal function.  The left ventricle has no regional wall motion abnormalities. Left ventricular diastolic parameters were normal.  2. Right ventricular systolic function is normal. The right ventricular size is normal. There is normal pulmonary artery systolic pressure.  3. The mitral valve is normal in structure. Trivial mitral valve regurgitation. No evidence of mitral stenosis.  4. The aortic valve was not well visualized. Aortic valve regurgitation is not visualized. No aortic stenosis is present.  5. Aortic dilatation noted. There is mild dilatation of the ascending aorta, measuring 38 mm. FINDINGS  Left Ventricle: Left ventricular ejection fraction, by estimation, is 55 to 60%. The left ventricle has normal function. The left ventricle has no regional wall motion abnormalities. The left ventricular internal cavity size was normal in size. There is  no left ventricular hypertrophy. Left ventricular diastolic parameters were normal. Right Ventricle: The right ventricular size is normal. No increase in right ventricular wall thickness. Right ventricular systolic function is normal. There is normal pulmonary artery systolic pressure. The tricuspid regurgitant velocity is 2.34 m/s, and   with an assumed right atrial pressure of 3 mmHg, the estimated right ventricular systolic pressure is 24.9 mmHg. Left Atrium: Left atrial size was normal in size. Right Atrium: Right atrial size was normal in size. Pericardium: Trivial pericardial effusion is present. Mitral Valve: The mitral valve is normal in structure. Trivial mitral valve regurgitation. No evidence of mitral valve stenosis. Tricuspid Valve: The tricuspid valve is normal in structure. Tricuspid valve regurgitation is trivial. Aortic Valve: The aortic valve was not well visualized. Aortic valve regurgitation is not visualized. No aortic stenosis is present. Aortic valve mean gradient measures 4.0 mmHg. Aortic valve peak gradient measures 7.0 mmHg. Aortic valve area, by VTI measures 3.79 cm. Pulmonic Valve: The pulmonic valve was not well visualized. Pulmonic valve regurgitation is not visualized. Aorta: Aortic dilatation noted and the aortic root is normal in size and structure. There is mild dilatation of the ascending aorta, measuring 38 mm. IAS/Shunts: The interatrial septum was not well visualized.  LEFT VENTRICLE PLAX 2D LVIDd:         5.80 cm      Diastology LVIDs:         3.70 cm      LV e' medial:    6.31 cm/s LV PW:         1.00 cm      LV E/e' medial:  14.1 LV IVS:        0.80 cm      LV e' lateral:   10.50 cm/s LVOT diam:     2.40 cm      LV E/e' lateral: 8.5 LV SV:         108 LV SV Index:   43 LVOT Area:     4.52 cm  LV Volumes (MOD) LV vol d, MOD A2C: 139.0 ml LV vol d, MOD A4C: 105.0 ml LV vol s, MOD A2C: 60.9 ml LV vol s, MOD A4C: 55.5 ml LV SV MOD A2C:     78.1 ml LV SV MOD A4C:     105.0 ml LV SV MOD BP:      65.7 ml RIGHT VENTRICLE RV Basal diam:  3.60 cm RV Mid diam:    3.10 cm LEFT ATRIUM             Index       RIGHT ATRIUM           Index LA Vol (A2C):   46.4 ml 18.65 ml/m  RA Area:     18.40 cm LA Vol (A4C):   50.1 ml 20.13 ml/m RA Volume:   56.30 ml  22.62 ml/m LA Biplane Vol: 53.4 ml 21.46 ml/m  AORTIC VALVE                    PULMONIC VALVE AV Area (Vmax):    3.84 cm    PV Vmax:       0.95 m/s AV Area (Vmean):   3.62 cm    PV Vmean:      65.200 cm/s AV Area (VTI):     3.79 cm    PV VTI:        0.194 m AV Vmax:           132.00 cm/s PV Peak grad:  3.6 mmHg AV Vmean:          90.500 cm/s PV Mean grad:  2.0 mmHg AV VTI:            0.284 m AV Peak Grad:      7.0 mmHg AV Mean Grad:      4.0 mmHg LVOT Vmax:         112.00 cm/s LVOT Vmean:        72.400 cm/s LVOT VTI:          0.238 m LVOT/AV VTI ratio: 0.84  AORTA Ao Root diam: 4.00 cm Ao Asc diam:  3.80 cm MITRAL VALVE               TRICUSPID VALVE MV Area (PHT): 3.60 cm    TR Peak grad:   21.9 mmHg MV Decel Time: 211 msec    TR Vmax:        234.00 cm/s MV E velocity: 88.80 cm/s MV A velocity: 97.00 cm/s  SHUNTS MV E/A ratio:  0.92        Systemic VTI:  0.24 m                            Systemic Diam: 2.40 cm Epifanio Lesches MD Electronically signed by Epifanio Lesches MD Signature Date/Time: 04/13/2021/10:17:07 AM    Final    CT HEAD CODE STROKE WO CONTRAST  Result Date: 04/12/2021 CLINICAL DATA:  Code stroke.  Neuro deficit, acute, stroke suspected EXAM: CT HEAD WITHOUT CONTRAST TECHNIQUE: Contiguous axial images were obtained from the base of the skull through the vertex without intravenous contrast. COMPARISON:  September 07, 2019. FINDINGS: Brain: No evidence of acute large vascular territory infarction, hemorrhage, hydrocephalus, extra-axial collection or mass lesion/mass effect. Vascular: No hyperdense vessel identified. Skull: No acute fracture. Sinuses/Orbits: Clear visualized sinuses. No acute orbital findings. Other: No mastoid effusions. ASPECTS [facility] Stroke Program Early CT Score) Total score (0-10 with 10 being normal): 10. IMPRESSION: 1. No evidence of acute large vascular territory infarct or acute hemorrhage. ASPECTS is 10. 2. Partially empty sella, which is often a normal anatomic variant but can be associated with idiopathic intracranial  hypertension. Code stroke imaging results were communicated on 04/12/2021 at 10:39 am to provider Dr. Otelia Limes via secure text paging. Electronically Signed   By: Feliberto Harts M.D.   On: 04/12/2021 10:42   CT ANGIO HEAD NECK W WO CM (CODE STROKE)  Result Date: 04/12/2021 CLINICAL DATA:  Stroke code, right-sided weakness EXAM: CT ANGIOGRAPHY HEAD AND NECK TECHNIQUE: Multidetector CT imaging of the head and neck was performed using the standard protocol during bolus administration of intravenous contrast.  Multiplanar CT image reconstructions and MIPs were obtained to evaluate the vascular anatomy. Carotid stenosis measurements (when applicable) are obtained utilizing NASCET criteria, using the distal internal carotid diameter as the denominator. CONTRAST:  60mL OMNIPAQUE IOHEXOL 350 MG/ML SOLN COMPARISON:  CT head 04/12/2021, CTA head 09/27/2019 FINDINGS: CT HEAD FINDINGS Brain: No evidence of acute infarction, hemorrhage, cerebral edema, mass, mass effect, or midline shift. Ventricles and sulci are normal for age. No extra-axial fluid collection. Partial empty sella. Vascular: No hyperdense vessel or unexpected calcification. Skull: Normal. Negative for fracture or focal lesion. Sinuses/Orbits: No acute finding. Other: The mastoid air cells are well aerated. Review of the MIP images confirms the above findings CTA NECK FINDINGS Aortic arch: Imaged portion shows no evidence of aneurysm or dissection. No significant stenosis of the major arch vessel origins. Right carotid system: No evidence of dissection, stenosis (50% or greater) or occlusion. Left carotid system: No evidence of dissection, stenosis (50% or greater) or occlusion. Vertebral arteries: Right dominant vertebral system. No evidence of dissection, stenosis (50% or greater) or occlusion. Skeleton: No acute osseous abnormality. Other neck: No soft tissue abnormality within the neck. No mass lesion or adenopathy. Upper chest: Negative. Review of the MIP  images confirms the above findings CTA HEAD FINDINGS Anterior circulation: Both internal carotid arteries are widely patent to the termini, without stenosis or other abnormality. A1 segments patent. Normal anterior communicating artery. Anterior cerebral arteries widely patent to their distal aspects. No M1 stenosis or occlusion. Normal MCA bifurcations. Distal MCA branches well perfused and symmetric. Posterior circulation: Vertebral arteries widely patent to the vertebrobasilar junction without stenosis. Posterior inferior cerebral arteries patent bilaterally. Basilar patent to its distal aspect. Superior cerebral arteries patent bilaterally. PCAs well perfused to their distal aspects without stenosis. Venous sinuses: As permitted by contrast timing, patent. Anatomic variants: Fetal origin of the right PCA. Near fetal origin of the left PCA. Review of the MIP images confirms the above findings IMPRESSION: No large vessel occlusion, hemodynamically significant stenosis, or other acute vascular abnormality. Electronically Signed   By: Wiliam KeAlison  Vasan M.D.   On: 04/12/2021 11:13    Subjective: Reports feeling better today  Discharge Exam: Vitals:   04/13/21 0847 04/13/21 1205  BP: 102/76 (!) 103/57  Pulse: 89 78  Resp: 17 19  Temp: 98.5 F (36.9 C) 99 F (37.2 C)  SpO2: 97% 96%   Vitals:   04/13/21 0656 04/13/21 0700 04/13/21 0847 04/13/21 1205  BP: (!) 116/91 119/90 102/76 (!) 103/57  Pulse: 71 78 89 78  Resp:   17 19  Temp:   98.5 F (36.9 C) 99 F (37.2 C)  TempSrc:   Oral Oral  SpO2: 99% 99% 97% 96%  Weight:      Height:        General: Pt is alert, awake, not in acute distress Cardiovascular: RRR, S1/S2  Respiratory: CTA bilaterally, no wheezing, no rhonchi Abdominal: Soft, NT, ND, bowel sounds + Extremities: no edema, no cyanosis   The results of significant diagnostics from this hospitalization (including imaging, microbiology, ancillary and laboratory) are listed below for  reference.     Microbiology: Recent Results (from the past 240 hour(s))  SARS CORONAVIRUS 2 (TAT 6-24 HRS) Nasopharyngeal Nasopharyngeal Swab     Status: None   Collection Time: 04/12/21  6:21 PM   Specimen: Nasopharyngeal Swab  Result Value Ref Range Status   SARS Coronavirus 2 NEGATIVE NEGATIVE Final    Comment: (NOTE) SARS-CoV-2 target nucleic acids are NOT DETECTED.  The SARS-CoV-2 RNA is generally detectable in upper and lower respiratory specimens during the acute phase of infection. Negative results do not preclude SARS-CoV-2 infection, do not rule out co-infections with other pathogens, and should not be used as the sole basis for treatment or other patient management decisions. Negative results must be combined with clinical observations, patient history, and epidemiological information. The expected result is Negative.  Fact Sheet for Patients: HairSlick.no  Fact Sheet for Healthcare Providers: quierodirigir.com  This test is not yet approved or cleared by the [place] FDA and  has been authorized for detection and/or diagnosis of SARS-CoV-2 by FDA under an Emergency Use Authorization (EUA). This EUA will remain  in effect (meaning this test can be used) for the duration of the COVID-19 declaration under Se ction 564(b)(1) of the Act, 21 U.S.C. section 360bbb-3(b)(1), unless the authorization is terminated or revoked sooner.  Performed at [facility], [address] [place], [place] [address]      Labs: BNP (last 3 results) No results for input(s): BNP in the last 8760 hours. Basic Metabolic Panel: Recent Labs  Lab 04/12/21 1026 04/12/21 1029  NA 139 136  K 4.6 4.4  CL 104 104  CO2  --  27  GLUCOSE 100* 99  BUN 18 13  CREATININE 1.10 1.04  CALCIUM  --  8.7*   Liver Function Tests: Recent Labs  Lab 04/12/21 1029  AST 33  ALT 29  ALKPHOS 69  BILITOT 1.3*  PROT 6.8  ALBUMIN 3.8    No results for input(s): LIPASE, AMYLASE in the last 168 hours. No results for input(s): AMMONIA in the last 168 hours. CBC: Recent Labs  Lab 04/12/21 1026 04/12/21 1029  WBC  --  7.4  NEUTROABS  --  4.4  HGB 13.9 14.8  HCT 41.0 42.7  MCV  --  89.7  PLT  --  212   Cardiac Enzymes: No results for input(s): CKTOTAL, CKMB, CKMBINDEX, TROPONINI in the last 168 hours. BNP: Invalid input(s): POCBNP CBG: Recent Labs  Lab 04/12/21 1023 04/12/21 1057  GLUCAP 94 83   D-Dimer No results for input(s): DDIMER in the last 72 hours. Hgb A1c Recent Labs    04/13/21 0057  HGBA1C 4.3*   Lipid Profile Recent Labs    04/13/21 0057  CHOL 170  HDL 34*  LDLCALC 99  TRIG 993*  CHOLHDL 5.0   Thyroid function studies No results for input(s): TSH, T4TOTAL, T3FREE, THYROIDAB in the last 72 hours.  Invalid input(s): FREET3 Anemia work up No results for input(s): VITAMINB12, FOLATE, FERRITIN, TIBC, IRON, RETICCTPCT in the last 72 hours. Urinalysis No results found for: COLORURINE, APPEARANCEUR, LABSPEC, PHURINE, GLUCOSEU, HGBUR, BILIRUBINUR, KETONESUR, PROTEINUR, UROBILINOGEN, NITRITE, LEUKOCYTESUR Sepsis Labs Invalid input(s): PROCALCITONIN,  WBC,  LACTICIDVEN Microbiology Recent Results (from the past 240 hour(s))  SARS CORONAVIRUS 2 (TAT 6-24 HRS) Nasopharyngeal Nasopharyngeal Swab     Status: None   Collection Time: 04/12/21  6:21 PM   Specimen: Nasopharyngeal Swab  Result Value Ref Range Status   SARS Coronavirus 2 NEGATIVE NEGATIVE Final    Comment: (NOTE) SARS-CoV-2 target nucleic acids are NOT DETECTED.  The SARS-CoV-2 RNA is generally detectable in upper and lower respiratory specimens during the acute phase of infection. Negative results do not preclude SARS-CoV-2 infection, do not rule out co-infections with other pathogens, and should not be used as the sole basis for treatment or other patient management decisions. Negative results must be combined with clinical  observations,  patient history, and epidemiological information. The expected result is Negative.  Fact Sheet for Patients: HairSlick.no  Fact Sheet for Healthcare Providers: quierodirigir.com  This test is not yet approved or cleared by the [place] FDA and  has been authorized for detection and/or diagnosis of SARS-CoV-2 by FDA under an Emergency Use Authorization (EUA). This EUA will remain  in effect (meaning this test can be used) for the duration of the COVID-19 declaration under Se ction 564(b)(1) of the Act, 21 U.S.C. section 360bbb-3(b)(1), unless the authorization is terminated or revoked sooner.  Performed at [facility], [address] [place], [place] [address]    Time spent:  SIGNED:   Rickey Barbara, MD  [facility] 04/13/2021, 3:40 PM  If 7PM-7AM, please contact night-coverage

## 2021-04-13 NOTE — Evaluation (Signed)
Occupational Therapy Evaluation Patient Details Name: Roy Snyder MRN: [id number] DOB: 1976/03/09 Today's Date: 04/13/2021    History of Present Illness Roy Snyder is a 45 y.o. male with medical history significant of anxiety/depression, vertigo, sleep apnea came with altered mentation and strokelike symptoms. Was non-verbal and unable to move when admitted. MRI negative   Clinical Impression   Pt admitted for concerns listed above. PTA pt reported that he was independent with all ADL's and IADL's, including working as a Education officer, environmental and going to [employer] At this time, pt demonstrates increased weakness and balance deficits, requiring the use of a RW. Pt limited by activity tolerance, unable to walk 200 ft to nurses station an back. Exectutive function WFL, as pt was provided a multi step command, which he was able to remember after taking his medications, then complete with no assist or cuing. Pt is requiring further OT services at this time, however pt is expected to continue progressing, and may not need further OT services. OT will follow acutely.    Follow Up Recommendations  Home health OT (As pt progresses he may not need HHOT.)    Equipment Recommendations  Tub/shower seat;Other (comment) (RW)    Recommendations for Other Services       Precautions / Restrictions Precautions Precautions: Fall Precaution Comments: mod fall Restrictions Weight Bearing Restrictions: No      Mobility Bed Mobility Overal bed mobility: Modified Independent             General bed mobility comments: use of bed rails, no physical assist provided    Transfers Overall transfer level: Needs assistance Equipment used: Rolling walker (2 wheeled) Transfers: Sit to/from Stand Sit to Stand: Min guard              Balance Overall balance assessment: Mild deficits observed, not formally tested                                         ADL either performed or assessed with  clinical judgement   ADL Overall ADL's : Needs assistance/impaired Eating/Feeding: Supervision/ safety;Sitting   Grooming: Set up;Sitting   Upper Body Bathing: Supervision/ safety;Sitting   Lower Body Bathing: Minimal assistance;Sitting/lateral leans;Sit to/from stand   Upper Body Dressing : Supervision/safety;Sitting   Lower Body Dressing: Minimal assistance;Sitting/lateral leans;Sit to/from stand   Toilet Transfer: Min guard;Minimal assistance;Ambulation   Toileting- Clothing Manipulation and Hygiene: Minimal assistance;Sitting/lateral lean;Sit to/from stand   Tub/ Shower Transfer: Minimal assistance;Ambulation   Functional mobility during ADLs: Min guard;Rolling walker General ADL Comments: Pt experieincing increased balance concerns and weakness in BLE. Pt activity tolerance is low, requiring him to need supervision-min A for all ADL's     Vision Baseline Vision/History: 0 No visual deficits Ability to See in Adequate Light: 0 Adequate Patient Visual Report: No change from baseline Vision Assessment?: No apparent visual deficits     Perception Perception Perception Tested?: No   Praxis Praxis Praxis tested?: Within functional limits    Pertinent Vitals/Pain Pain Assessment: No/denies pain Faces Pain Scale: Hurts a little bit Pain Location: R shoulder Pain Descriptors / Indicators: Discomfort;Sore     Hand Dominance Right   Extremity/Trunk Assessment Upper Extremity Assessment Upper Extremity Assessment: RUE deficits/detail RUE Deficits / Details: painful with ROM and dangling when standing RUE Coordination: decreased gross motor   Lower Extremity Assessment Lower Extremity Assessment: Defer to PT evaluation   Cervical /  Trunk Assessment Cervical / Trunk Assessment: Normal   Communication Communication Communication: No difficulties   Cognition Arousal/Alertness: Awake/alert Behavior During Therapy: WFL for tasks assessed/performed Overall Cognitive  Status: Within Functional Limits for tasks assessed                                     General Comments  VSS on RA, pt mom in the room    Exercises     Shoulder Instructions      Home Living Family/patient expects to be discharged to:: Private residence Living Arrangements: Spouse/significant other;Children Available Help at Discharge: Family;Available 24 hours/day Type of Home: House Home Access: Stairs to enter [employer] of Steps: 4 steps Entrance Stairs-Rails: Can reach both Home Layout: One level     Bathroom Shower/Tub: Chief Strategy Officer: Standard     Home Equipment: None          Prior Functioning/Environment Level of Independence: Independent                 OT Problem List: Decreased strength;Decreased activity tolerance;Impaired balance (sitting and/or standing);Decreased knowledge of use of DME or AE;Obesity;Impaired UE functional use;Pain      OT Treatment/Interventions: Self-care/ADL training;Therapeutic exercise;Energy conservation;DME and/or AE instruction;Therapeutic activities;Patient/family education;Balance training    OT Goals(Current goals can be found in the care plan section) Acute Rehab OT Goals Patient Stated Goal: to return home OT Goal Formulation: With patient Time For Goal Achievement: 04/27/21 Potential to Achieve Goals: Good ADL Goals Pt Will Perform Grooming: Independently;standing Pt Will Perform Lower Body Bathing: with modified independence;sitting/lateral leans;sit to/from stand Pt Will Perform Lower Body Dressing: with modified independence;sit to/from stand;sitting/lateral leans Pt Will Transfer to Toilet: with modified independence;ambulating Pt Will Perform Toileting - Clothing Manipulation and hygiene: with modified independence;sitting/lateral leans;sit to/from stand  OT Frequency: Min 2X/week   Barriers to D/C:            Co-evaluation              AM-PAC OT  "6 Clicks" Daily Activity     Outcome Measure Help from another person eating meals?: A Little Help from another person taking care of personal grooming?: A Little Help from another person toileting, which includes using toliet, bedpan, or urinal?: A Little Help from another person bathing (including washing, rinsing, drying)?: A Little Help from another person to put on and taking off regular upper body clothing?: A Little Help from another person to put on and taking off regular lower body clothing?: A Little 6 Click Score: 18   End of Session Equipment Utilized During Treatment: Rolling walker Nurse Communication: Mobility status  Activity Tolerance: Patient tolerated treatment well Patient left: in bed;with call bell/phone within reach;with family/visitor present  OT Visit Diagnosis: Unsteadiness on feet (R26.81);Other abnormalities of gait and mobility (R26.89);Muscle weakness (generalized) (M62.81)                Time: 5726-2035 OT Time Calculation (min): 27 min Charges:  OT General Charges $OT Visit: 1 Visit OT Evaluation $OT Eval Moderate Complexity: 1 Mod OT Treatments $Therapeutic Activity: 8-22 mins  Gualberto Wahlen H., OTR/L Acute Rehabilitation  Quin Mcpherson Elane Ashlee Player 04/13/2021, 1:02 PM

## 2021-04-13 NOTE — TOC Transition Note (Signed)
Transition of Care [facility]) - CM/SW Discharge Note   Patient Details  Name: Roy Snyder MRN: [id number] Date of Birth: 1976-04-26  Transition of Care [facility]) CM/SW Contact:  Kermit Balo, RN Phone Number: 04/13/2021, 2:43 PM   Clinical Narrative:    Pt denies issues with transportation or home meds. Pt has CPAP at home.  Patient discharging home with [facility] services through [facility]. Information on the AVS.  Walker and 3 in 1 ordered through [facility] and will be delivered to the room. Pt has supervision at home and transportation to home.   Final next level of care: Home w Home Health Services Barriers to Discharge: No Barriers Identified   Patient Goals and CMS Choice   CMS Medicare.gov Compare Post Acute Care list provided to:: Patient Choice offered to / list presented to : Patient, Spouse  Discharge Placement                       Discharge Plan and Services                DME Arranged: 3-N-1, Walker rolling DME Agency: [facility] Date DME Agency Contacted: 04/13/21   Representative spoke with at DME Agency: Velna Hatchet HH Arranged: PT, OT [facility] Agency: [facility] Date [facility] Agency Contacted: 04/13/21   Representative spoke with at [facility] Agency: Misty Stanley  Social Determinants of Health (SDOH) Interventions     Readmission Risk Interventions No flowsheet data found.

## 2021-04-13 NOTE — Evaluation (Signed)
Physical Therapy Evaluation Patient Details Name: Roy Snyder MRN: [id number] DOB: 05/25/1976 Today's Date: 04/13/2021   History of Present Illness  Roy Snyder is a 45 y.o. male with medical history significant of anxiety/depression, vertigo, sleep apnea came with altered mentation and strokelike symptoms. Was non-verbal and unable to move when admitted. MRI negative   Clinical Impression  Patient received in bed, reports he feels better than yesterday. He is mod independent with bed mobility and transfers with min guard. Ambulated 40 feet in room with RW and min guard. Slow, cautious gait mild LE weakness, no LOB. Patient will continue to benefit from skilled PT while here to improve functional independence and safety at home.         Follow Up Recommendations No PT follow up    Equipment Recommendations  Rolling walker with 5" wheels    Recommendations for Other Services       Precautions / Restrictions Precautions Precaution Comments: mod fall Restrictions Weight Bearing Restrictions: No      Mobility  Bed Mobility Overal bed mobility: Modified Independent             General bed mobility comments: use of bed rails, no physical assist provided    Transfers Overall transfer level: Needs assistance Equipment used: Rolling walker (2 wheeled) Transfers: Sit to/from Stand Sit to Stand: Min guard            Ambulation/Gait Ambulation/Gait assistance: Min guard Gait Distance (Feet): 40 Feet Assistive device: Rolling walker (2 wheeled) Gait Pattern/deviations: Step-through pattern;Decreased step length - right;Decreased step length - left Gait velocity: decr   General Gait Details: steady with ambulation, ambulates like he is unsure of himself but no lob.  Stairs            Wheelchair Mobility    Modified Rankin (Stroke Patients Only) Modified Rankin (Stroke Patients Only) Pre-Morbid Rankin Score: No symptoms Modified Rankin: Moderate  disability     Balance Overall balance assessment: Modified Independent;No apparent balance deficits (not formally assessed)                                           Pertinent Vitals/Pain Pain Assessment: Faces Faces Pain Scale: Hurts a little bit Pain Location: R shoulder Pain Descriptors / Indicators: Discomfort;Sore    Home Living Family/patient expects to be discharged to:: Private residence Living Arrangements: Spouse/significant other Available Help at Discharge: Family;Available 24 hours/day Type of Home: House Home Access: Stairs to enter       Home Equipment: None      Prior Function Level of Independence: Independent               Hand Dominance        Extremity/Trunk Assessment   Upper Extremity Assessment Upper Extremity Assessment: RUE deficits/detail RUE Deficits / Details: painful with ROM and resisted movement    Lower Extremity Assessment Lower Extremity Assessment: Generalized weakness    Cervical / Trunk Assessment Cervical / Trunk Assessment: Normal  Communication   Communication: No difficulties  Cognition Arousal/Alertness: Awake/alert Behavior During Therapy: WFL for tasks assessed/performed Overall Cognitive Status: Within Functional Limits for tasks assessed                                        General Comments  Exercises     Assessment/Plan    PT Assessment Patient needs continued PT services  PT Problem List Decreased strength;Decreased mobility;Decreased activity tolerance       PT Treatment Interventions Therapeutic exercise;Gait training;Stair training;Functional mobility training;Therapeutic activities;Patient/family education;Balance training;DME instruction    PT Goals (Current goals can be found in the Care Plan section)  Acute Rehab PT Goals Patient Stated Goal: to return home PT Goal Formulation: With patient/family Time For Goal Achievement: 04/20/21 Potential  to Achieve Goals: Good    Frequency Min 3X/week   Barriers to discharge        Co-evaluation               AM-PAC PT "6 Clicks" Mobility  Outcome Measure Help needed turning from your back to your side while in a flat bed without using bedrails?: None Help needed moving from lying on your back to sitting on the side of a flat bed without using bedrails?: None Help needed moving to and from a bed to a chair (including a wheelchair)?: A Little Help needed standing up from a chair using your arms (e.g., wheelchair or bedside chair)?: A Little Help needed to walk in hospital room?: A Little Help needed climbing 3-5 steps with a railing? : A Little 6 Click Score: 20    End of Session Equipment Utilized During Treatment: Gait belt Activity Tolerance: Patient tolerated treatment well Patient left: in bed;with call bell/phone within reach;with family/visitor present Nurse Communication: Mobility status PT Visit Diagnosis: Other abnormalities of gait and mobility (R26.89);Muscle weakness (generalized) (M62.81)    Time: 0321-2248 PT Time Calculation (min) (ACUTE ONLY): 23 min   Charges:   PT Evaluation $PT Eval Moderate Complexity: 1 Mod PT Treatments $Gait Training: 8-22 mins        Cheri Ayotte, PT, GCS 04/13/21,10:31 AM

## 2021-04-13 NOTE — Plan of Care (Signed)

## 2021-04-13 NOTE — Progress Notes (Signed)
*  PRELIMINARY RESULTS* Echocardiogram 2D Echocardiogram has been performed.  Neomia Dear RDCS 04/13/2021, 9:07 AM

## 2021-04-13 NOTE — Procedures (Signed)
Patient Name: Roy Snyder  MRN: [id number]  Epilepsy Attending: Charlsie Quest  Referring Physician/Provider: Dr Caryl Pina Date: 04/13/2021 Duration: 24.03 mins  Patient history: 45 year old male with alteration of awareness.  EEG to evaluate for seizures.  Level of alertness: Awake, asleep  AEDs during EEG study: None  Technical aspects: This EEG study was done with scalp electrodes positioned according to the 10-20 International system of electrode placement. Electrical activity was acquired at a sampling rate of 500Hz  and reviewed with a high frequency filter of 70Hz  and a low frequency filter of 1Hz . EEG data were recorded continuously and digitally stored.   Description: The posterior dominant rhythm consists of 10 Hz activity of moderate voltage (25-35 uV) seen predominantly in posterior head regions, symmetric and reactive to eye opening and eye closing.  Sleep was characterized by vertex waves, sleep spindles (12 to 14 Hz), maximal frontocentral region.   Hyperventilation and photic stimulation were not performed.     IMPRESSION: This study is within normal limits. No seizures or epileptiform discharges were seen throughout the recording.  [name] 

## 2021-04-13 NOTE — Plan of Care (Signed)
Obtaining an EEG given the patient's episode of awake unresponsiveness. This study will be low yield since patient's presentation was not suggestive of a seizure. He will need outpatient Neurology follow up.   Electronically signed: Dr. Caryl Pina

## 2021-04-13 NOTE — Progress Notes (Signed)
Initial Nutrition Assessment  DOCUMENTATION CODES:  Morbid obesity  INTERVENTION:  Continue regular diet.  Add Magic cup with dinner meal, each supplement provides 290 kcal and 9 grams of protein.  Add MVI with minerals daily.  NUTRITION DIAGNOSIS:  Increased nutrient needs related to acute illness as evidenced by estimated needs.  GOAL:  Patient will meet greater than or equal to 90% of their needs  MONITOR:  PO intake, Supplement acceptance, Labs, Weight trends, I & O's  REASON FOR ASSESSMENT:  Malnutrition Screening Tool    ASSESSMENT:  45 yo male with a PMH of anxiety/depression, vertigo, sleep apnea came with altered mentation and strokelike symptoms.  Pt with no documented intake per Epic.   Neurology ruled out brain tumor, brain bleed, and stroke with tests today. Recommending following up outpatient to determine the cause of his "awake unresponsive episodes" at home.  Per Epic, pt's weight is stable. Pt also does not have any noted edema.  Recommend adding MVI with minerals and Magic Cup once daily.  Medications: reviewed; Pepcid BID, Protonix  Labs: reviewed  NUTRITION - FOCUSED PHYSICAL EXAM: Flowsheet Row Most Recent Value  Orbital Region No depletion  Upper Arm Region No depletion  Thoracic and Lumbar Region No depletion  Buccal Region No depletion  Temple Region No depletion  Clavicle Bone Region No depletion  Clavicle and Acromion Bone Region No depletion  Scapular Bone Region No depletion  Dorsal Hand No depletion  Patellar Region No depletion  Anterior Thigh Region No depletion  Posterior Calf Region No depletion  Edema (RD Assessment) None  Hair Reviewed  Eyes Reviewed  Mouth Reviewed  Skin Reviewed  Nails Reviewed   Diet Order:   Diet Order             Diet regular Room service appropriate? Yes; Fluid consistency: Thin  Diet effective now                  EDUCATION NEEDS:  No education needs have been identified at this  time  Skin:  Skin Assessment: Reviewed RN Assessment  Last BM:  no BM documented  Height:  Ht Readings from Last 1 Encounters:  04/12/21 5\' 10"  (1.778 m)   Weight:  Wt Readings from Last 1 Encounters:  04/12/21 (!) 137.2 kg   BMI:  Body mass index is 43.4 kg/m.  Estimated Nutritional Needs:  Kcal:  2000-2200 Protein:  100-115 grams Fluid:  >2 L  06/12/21, RD, LDN (she/her/hers) Registered Dietitian I After-Hours/Weekend Pager # in [place]

## 2021-04-13 NOTE — Plan of Care (Signed)
NO CHARGE NOTE:  Neurology:  Family requested neurology come speak with them. NP went to bedside and reassured family that we ruled out three serious diagnoses-brain tumor, brain bleed, and stroke. Spoke to pt and family about his HAs. He has had HA with orgasm and HA accompanied by numbness and tingling prior. We spoke about complicated and atypical MHAs and he agrees to f/up out patient with neuro for HAs. We discussed that it would be reasonable to check an EEG due to his awake unresponsive episode at home. Has history of concussions playing football.   Wife stated there was no way for patient to get up stairs if they go home and that some men from church are going to build a ramp to enter home. She is going to check to see if someone can come over and assist with patient if he leaves tonight.   NP spent 30-40 minutes with patient and family, all of which was counseling and plan of care. Questions were asked and answered to family's satisfaction.   Communicated above to hospitalist attending.   Jimmye Norman, NP Neurology

## 2021-04-13 NOTE — Plan of Care (Signed)
Pt is alert oriented x 4. Pt wife verbalized that pts pastor arrived and prayed for pt. Pt felt tingling and then suddenly was able to talk and move his arm and legs. Assessment completed, pt has equal strength bilaterally. Pt does c/o right shoulder pain from fall. PRN tylenol given with effective results.     Problem: Education: Goal: Knowledge of General Education information will improve Description: Including pain rating scale, medication(s)/side effects and non-pharmacologic comfort measures Outcome: Progressing   Problem: Health Behavior/Discharge Planning: Goal: Ability to manage health-related needs will improve Outcome: Progressing   Problem: Clinical Measurements: Goal: Ability to maintain clinical measurements within normal limits will improve Outcome: Progressing Goal: Will remain free from infection Outcome: Progressing Goal: Diagnostic test results will improve Outcome: Progressing Goal: Respiratory complications will improve Outcome: Progressing Goal: Cardiovascular complication will be avoided Outcome: Progressing   Problem: Activity: Goal: Risk for activity intolerance will decrease Outcome: Progressing   Problem: Nutrition: Goal: Adequate nutrition will be maintained Outcome: Progressing   Problem: Coping: Goal: Level of anxiety will decrease Outcome: Progressing   Problem: Elimination: Goal: Will not experience complications related to bowel motility Outcome: Progressing Goal: Will not experience complications related to urinary retention Outcome: Progressing   Problem: Pain Managment: Goal: General experience of comfort will improve Outcome: Progressing   Problem: Safety: Goal: Ability to remain free from injury will improve Outcome: Progressing   Problem: Skin Integrity: Goal: Risk for impaired skin integrity will decrease Outcome: Progressing

## 2021-04-13 NOTE — Evaluation (Signed)
Speech Language Pathology Evaluation Patient Details Name: Roy Snyder MRN: [id number] DOB: 04-26-1976 Today's Date: 04/13/2021 Time: 2409-7353 SLP Time Calculation (min) (ACUTE ONLY): 13 min  Problem List:  Patient Active Problem List   Diagnosis Date Noted   TIA (transient ischemic attack) 04/12/2021   Aphasia    Weakness    Prediabetes 09/20/2020   Asymptomatic varicose veins of both lower extremities 04/01/2019   Diastasis recti 04/01/2019   Morbid obesity with BMI of 40.0-44.9, adult (HCC) 08/16/2016   Obstructive sleep apnea syndrome 08/16/2016   Past Medical History:  Past Medical History:  Diagnosis Date   Anxiety    Sleep apnea    Past Surgical History:  Past Surgical History:  Procedure Laterality Date   VENOUS ABLATION Bilateral    HPI:  Roy Snyder is a 45 y.o. male presents with altered mentation and strokelike symptoms. Was non-verbal and unable to move when admitted. MRI and head CT (04/12/21) negative for acute findings. PMH: anxiety/depression, vertigo, sleep apnea.   Assessment / Plan / Recommendation Clinical Impression  Pt presents with functional speech, language and cognitive abilities and is suspected to be at baseline. Upon SLP arrival, pt and family expressed concerns about word finding and verbal expression with recent incident and hospitalization. Given structured and informal expressive and receptive language tasks, including: object naming, following commands and formulating short verbal narrative, pt demonstrated no functional impairment. No signs of motor speech or cognitive deficits noted as well. SLP f/u is not warranted at this time. Will s/o.    SLP Assessment  SLP Recommendation/Assessment: Patient does not need any further Speech Lanaguage Pathology Services SLP Visit Diagnosis: Cognitive communication deficit (R41.841)    Follow Up Recommendations  None    Frequency and Duration           SLP Evaluation Cognition  Overall  Cognitive Status: Within Functional Limits for tasks assessed Arousal/Alertness: Awake/alert Orientation Level: Oriented X4 Year: 2022 Month: September Day of Week: Correct       Comprehension  Auditory Comprehension Overall Auditory Comprehension: Appears within functional limits for tasks assessed Visual Recognition/Discrimination Discrimination: Not tested Reading Comprehension Reading Status: Not tested    Expression Expression Primary Mode of Expression: Verbal Verbal Expression Overall Verbal Expression: Appears within functional limits for tasks assessed Initiation: No impairment Level of Generative/Spontaneous Verbalization: Conversation Naming: No impairment Pragmatics: No impairment Written Expression Dominant Hand: Right Written Expression: Not tested   Oral / Motor  Oral Motor/Sensory Function Overall Oral Motor/Sensory Function: Within functional limits Motor Speech Overall Motor Speech: Appears within functional limits for tasks assessed   GO                   Roy Echevaria, MA, CCC-SLP Acute Rehabilitation Services Office Number: [phone]  Paulette Blanch 04/13/2021, 2:18 PM

## 2021-04-18 ENCOUNTER — Encounter: Payer: Self-pay | Admitting: Diagnostic Neuroimaging

## 2021-04-18 ENCOUNTER — Ambulatory Visit: Payer: 59 | Admitting: Diagnostic Neuroimaging

## 2021-04-18 VITALS — BP 121/83 | HR 65 | Ht 71.0 in | Wt 275.0 lb

## 2021-04-18 DIAGNOSIS — G4485 Primary stabbing headache: Secondary | ICD-10-CM | POA: Diagnosis not present

## 2021-04-18 DIAGNOSIS — R531 Weakness: Secondary | ICD-10-CM

## 2021-04-18 NOTE — Progress Notes (Signed)
 [facility]  PATIENT: Roy Snyder DOB: 10/23/1975  REFERRING CLINICIAN: Kirby-Graham, Karen J, * HISTORY FROM: patient  REASON FOR VISIT: new consult    HISTORICAL  CHIEF COMPLAINT:  Chief Complaint  Patient presents with   Follow-up    RM 7 with spouse megan and mom suzie  Pt is well,  states he blacked out Thursday and couldn't move or speak as if he was paralyzed but ED couldn't find stroke evidence.  Since discharged he is still having weakness in legs and cant walk without assistance and having headaches.     HISTORY OF PRESENT ILLNESS:   44-year-old male here for evaluation of headaches and abnormal spell.  04/12/2021 patient was at home, had finished workout with son and was eating breakfast.  He stood up and felt sharp intense pain on the left side of his head.  He fell to his knees and apparently passed out.  He was unresponsive for few minutes.  His children witnessed this and call 911.  Patient woke up within a few minutes but was unable to speak and unable to move.  He was taken the hospital for evaluation.  Stroke work-up was completed.  No acute findings found.  Fluctuating symptoms were noted and possibility of stress/conversion reaction was raised.  Since that time symptoms are improving.  He is able to walk using a walker.  His speech and language is back to normal.  He still feels some weakness and numbness on the right side.  Patient has had some problems with depression especially in 2021 and was seeing a counselor and on medication.  Also the week prior to his event in September 2022 his 19-year-old daughter suddenly announced that she was moving out of the home to live with a boyfriend she had met just 1 to 2 weeks earlier.  Patient works as a pastor at a nearby church.   REVIEW OF SYSTEMS: Full 14 system review of systems performed and negative with exception of: as per HPI.   ALLERGIES: No Known Allergies  HOME  MEDICATIONS: Outpatient Medications Prior to Visit  Medication Sig Dispense Refill   cholecalciferol (VITAMIN D3) 25 MCG (1000 UNIT) tablet Take 1,000 Units by mouth daily.     dicyclomine (BENTYL) 20 MG tablet Take 20 mg by mouth as directed. Take 1 tablet BID AS WELL AS WHEN EATING A MEAL     escitalopram (LEXAPRO) 10 MG tablet Take 10 mg by mouth at bedtime.     famotidine (PEPCID) 20 MG tablet Take 20 mg by mouth 2 (two) times daily.     fluticasone (FLONASE) 50 MCG/ACT nasal spray Place 1 spray into both nostrils daily.     loperamide (IMODIUM) 2 MG capsule Take 2 mg by mouth 2 (two) times daily as needed for diarrhea or loose stools.     ondansetron (ZOFRAN-ODT) 4 MG disintegrating tablet Take 4 mg by mouth every 8 (eight) hours as needed for nausea or vomiting.     pantoprazole (PROTONIX) 40 MG tablet Take 40 mg by mouth daily.     vitamin C (ASCORBIC ACID) 500 MG tablet Take 500 mg by mouth daily.     zinc gluconate 50 MG tablet Take 50 mg by mouth daily.     No facility-administered medications prior to visit.    PAST MEDICAL HISTORY: Past Medical History:  Diagnosis Date   Anxiety    Sleep apnea     PAST SURGICAL HISTORY: Past Surgical History:  Procedure Laterality Date     VENOUS ABLATION Bilateral     FAMILY HISTORY: History reviewed. No pertinent family history.  SOCIAL HISTORY: Social History   Socioeconomic History   Marital status: Married    Spouse name: Not on file   Number of children: Not on file   Years of education: Not on file   Highest education level: Not on file  Occupational History   Not on file  Tobacco Use   Smoking status: Never   Smokeless tobacco: Never  Vaping Use   Vaping Use: Never used  Substance and Sexual Activity   Alcohol use: Not Currently   Drug use: Never   Sexual activity: Not on file  Other Topics Concern   Not on file  Social History Narrative   Not on file   Social Determinants of Health   Financial Resource  Strain: Not on file  Food Insecurity: Not on file  Transportation Needs: Not on file  Physical Activity: Not on file  Stress: Not on file  Social Connections: Not on file  Intimate Partner Violence: Not on file     PHYSICAL EXAM  GENERAL EXAM/CONSTITUTIONAL: Vitals:  Vitals:   04/18/21 0829  BP: 121/83  Pulse: 65  Weight: 275 lb (124.7 kg)  Height: 5' 11" (1.803 m)   Body mass index is 38.35 kg/m. Wt Readings from Last 3 Encounters:  04/18/21 275 lb (124.7 kg)  04/12/21 (!) 302 lb 7.5 oz (137.2 kg)  01/11/21 300 lb (136.1 kg)   Patient is in no distress; well developed, nourished and groomed; neck is supple  CARDIOVASCULAR: Examination of carotid arteries is normal; no carotid bruits Regular rate and rhythm, no murmurs Examination of peripheral vascular system by observation and palpation is normal  EYES: Ophthalmoscopic exam of optic discs and posterior segments is normal; no papilledema or hemorrhages No results found.  MUSCULOSKELETAL: Gait, strength, tone, movements noted in Neurologic exam below  NEUROLOGIC: MENTAL STATUS:  No flowsheet data found. awake, alert, oriented to person, place and time recent and remote memory intact normal attention and concentration language fluent, comprehension intact, naming intact fund of knowledge appropriate  CRANIAL NERVE:  2nd - no papilledema on fundoscopic exam 2nd, 3rd, 4th, 6th - pupils equal and reactive to light, visual fields full to confrontation, extraocular muscles intact, no nystagmus 5th - facial sensation symmetric 7th - facial strength symmetric 8th - hearing intact 9th - palate elevates symmetrically, uvula midline 11th - shoulder shrug symmetric 12th - tongue protrusion midline  MOTOR:  normal bulk and tone, full strength in the BUE, BLE; SUBTLE WEAKNESS IN RUE AND RLE  SENSORY:  normal and symmetric to light touch, temperature, vibration; DECR IN RIGHT SIDE  COORDINATION:   finger-nose-finger, fine finger movements normal  REFLEXES:  deep tendon reflexes 1+ and symmetric  GAIT/STATION:  USING WALKER     DIAGNOSTIC DATA (LABS, IMAGING, TESTING) - I reviewed patient records, labs, notes, testing and imaging myself where available.  Lab Results  Component Value Date   WBC 7.4 04/12/2021   HGB 14.8 04/12/2021   HCT 42.7 04/12/2021   MCV 89.7 04/12/2021   PLT 212 04/12/2021      Component Value Date/Time   NA 136 04/12/2021 1029   K 4.4 04/12/2021 1029   CL 104 04/12/2021 1029   CO2 27 04/12/2021 1029   GLUCOSE 99 04/12/2021 1029   BUN 13 04/12/2021 1029   CREATININE 1.04 04/12/2021 1029   CALCIUM 8.7 (L) 04/12/2021 1029   PROT 6.8 04/12/2021 1029  ALBUMIN 3.8 04/12/2021 1029   AST 33 04/12/2021 1029   ALT 29 04/12/2021 1029   ALKPHOS 69 04/12/2021 1029   BILITOT 1.3 (H) 04/12/2021 1029   GFRNONAA >60 04/12/2021 1029   Lab Results  Component Value Date   CHOL 170 04/13/2021   HDL 34 (L) 04/13/2021   LDLCALC 99 04/13/2021   TRIG 186 (H) 04/13/2021   CHOLHDL 5.0 04/13/2021   Lab Results  Component Value Date   HGBA1C 4.3 (L) 04/13/2021   No results found for: VITAMINB12 No results found for: TSH   04/12/21 CTA head / neck - No large vessel occlusion, hemodynamically significant stenosis, or other acute vascular abnormality.  04/12/21 MRI brain - Mildly motion degraded exam. - No evidence of acute intracranial abnormality. - Partially empty sella turcica. This finding is very commonly incidental, but can be associated with idiopathic intracranial hypertension. - Otherwise unremarkable non-contrast MRI appearance of the brain.  04/13/21 TTE  1. Left ventricular ejection fraction, by estimation, is 55 to 60%. The  left ventricle has normal function. The left ventricle has no regional  wall motion abnormalities. Left ventricular diastolic parameters were  normal.   2. Right ventricular systolic function is normal. The right  ventricular  size is normal. There is normal pulmonary artery systolic pressure.   3. The mitral valve is normal in structure. Trivial mitral valve  regurgitation. No evidence of mitral stenosis.   4. The aortic valve was not well visualized. Aortic valve regurgitation  is not visualized. No aortic stenosis is present.   5. Aortic dilatation noted. There is mild dilatation of the ascending  aorta, measuring 38 mm.     ASSESSMENT AND PLAN  44 y.o. year old male here with sudden onset headache 04/12/2021 in the setting of increased stress, likely primary stabbing headache.  Also with abnormal aphasia, weakness, numbness, with fluctuating symptoms and suspicion for conversion/stress reaction.   Dx:  1. Primary stabbing headache   2. Weakness      PLAN:  PRIMARY STABBING HEADACHE, WEAKNESS, NUMBNESS, STRESS REACTION - continue tylenol as needed - continue home PT, OT  Return for pending if symptoms worsen or fail to improve.    VIKRAM R. PENUMALLI, MD 04/18/2021, 10:46 AM Certified in Neurology, Neurophysiology and Neuroimaging  [facility] [address], [address] [place], [place] [address] [phone]  

## 2021-08-28 DIAGNOSIS — R1110 Vomiting, unspecified: Secondary | ICD-10-CM | POA: Diagnosis not present

## 2021-08-28 DIAGNOSIS — R519 Headache, unspecified: Secondary | ICD-10-CM | POA: Diagnosis not present

## 2021-08-28 DIAGNOSIS — H81399 Other peripheral vertigo, unspecified ear: Secondary | ICD-10-CM | POA: Diagnosis not present

## 2021-08-28 DIAGNOSIS — Z20828 Contact with and (suspected) exposure to other viral communicable diseases: Secondary | ICD-10-CM | POA: Diagnosis not present

## 2021-08-29 DIAGNOSIS — Z6841 Body Mass Index (BMI) 40.0 and over, adult: Secondary | ICD-10-CM | POA: Diagnosis not present

## 2021-08-29 DIAGNOSIS — G43009 Migraine without aura, not intractable, without status migrainosus: Secondary | ICD-10-CM | POA: Diagnosis not present

## 2021-08-29 DIAGNOSIS — R69 Illness, unspecified: Secondary | ICD-10-CM | POA: Diagnosis not present

## 2021-10-25 DIAGNOSIS — G43009 Migraine without aura, not intractable, without status migrainosus: Secondary | ICD-10-CM | POA: Diagnosis not present

## 2021-10-25 DIAGNOSIS — R69 Illness, unspecified: Secondary | ICD-10-CM | POA: Diagnosis not present

## 2021-10-25 DIAGNOSIS — L259 Unspecified contact dermatitis, unspecified cause: Secondary | ICD-10-CM | POA: Diagnosis not present

## 2021-10-25 DIAGNOSIS — Z6841 Body Mass Index (BMI) 40.0 and over, adult: Secondary | ICD-10-CM | POA: Diagnosis not present

## 2021-10-29 DIAGNOSIS — L259 Unspecified contact dermatitis, unspecified cause: Secondary | ICD-10-CM | POA: Diagnosis not present

## 2021-10-29 DIAGNOSIS — B86 Scabies: Secondary | ICD-10-CM | POA: Diagnosis not present

## 2021-10-29 DIAGNOSIS — Z6841 Body Mass Index (BMI) 40.0 and over, adult: Secondary | ICD-10-CM | POA: Diagnosis not present

## 2022-01-17 DIAGNOSIS — Z6841 Body Mass Index (BMI) 40.0 and over, adult: Secondary | ICD-10-CM | POA: Diagnosis not present

## 2022-01-17 DIAGNOSIS — G43009 Migraine without aura, not intractable, without status migrainosus: Secondary | ICD-10-CM | POA: Diagnosis not present

## 2022-04-10 DIAGNOSIS — R69 Illness, unspecified: Secondary | ICD-10-CM | POA: Diagnosis not present

## 2022-04-10 DIAGNOSIS — Z6841 Body Mass Index (BMI) 40.0 and over, adult: Secondary | ICD-10-CM | POA: Diagnosis not present

## 2022-04-16 DIAGNOSIS — J209 Acute bronchitis, unspecified: Secondary | ICD-10-CM | POA: Diagnosis not present

## 2022-04-16 DIAGNOSIS — J0100 Acute maxillary sinusitis, unspecified: Secondary | ICD-10-CM | POA: Diagnosis not present

## 2022-05-08 DIAGNOSIS — J329 Chronic sinusitis, unspecified: Secondary | ICD-10-CM | POA: Diagnosis not present

## 2022-05-08 DIAGNOSIS — J40 Bronchitis, not specified as acute or chronic: Secondary | ICD-10-CM | POA: Diagnosis not present

## 2022-05-08 DIAGNOSIS — U071 COVID-19: Secondary | ICD-10-CM | POA: Diagnosis not present

## 2022-09-11 ENCOUNTER — Emergency Department (HOSPITAL_COMMUNITY): Payer: BC Managed Care – PPO

## 2022-09-11 ENCOUNTER — Emergency Department (HOSPITAL_COMMUNITY)
Admission: EM | Admit: 2022-09-11 | Discharge: 2022-09-12 | Disposition: A | Discharge status: Home / Self Care | Payer: BC Managed Care – PPO | Attending: Emergency Medicine | Admitting: Emergency Medicine

## 2022-09-11 DIAGNOSIS — G43809 Other migraine, not intractable, without status migrainosus: Secondary | ICD-10-CM | POA: Diagnosis not present

## 2022-09-11 DIAGNOSIS — R4701 Aphasia: Secondary | ICD-10-CM | POA: Diagnosis not present

## 2022-09-11 DIAGNOSIS — R531 Weakness: Secondary | ICD-10-CM | POA: Diagnosis not present

## 2022-09-11 DIAGNOSIS — R2990 Unspecified symptoms and signs involving the nervous system: Secondary | ICD-10-CM | POA: Diagnosis not present

## 2022-09-11 DIAGNOSIS — F444 Conversion disorder with motor symptom or deficit: Secondary | ICD-10-CM | POA: Diagnosis not present

## 2022-09-11 DIAGNOSIS — R519 Headache, unspecified: Secondary | ICD-10-CM | POA: Diagnosis present

## 2022-09-11 DIAGNOSIS — F447 Conversion disorder with mixed symptom presentation: Secondary | ICD-10-CM | POA: Diagnosis not present

## 2022-09-11 DIAGNOSIS — G43109 Migraine with aura, not intractable, without status migrainosus: Secondary | ICD-10-CM

## 2022-09-11 LAB — DIFFERENTIAL
Abs Immature Granulocytes: 0.03 10*3/uL (ref 0.00–0.07)
Basophils Absolute: 0.1 10*3/uL (ref 0.0–0.1)
Basophils Relative: 1 %
Eosinophils Absolute: 0.1 10*3/uL (ref 0.0–0.5)
Eosinophils Relative: 2 %
Immature Granulocytes: 0 %
Lymphocytes Relative: 36 %
Lymphs Abs: 2.6 10*3/uL (ref 0.7–4.0)
Monocytes Absolute: 0.5 10*3/uL (ref 0.1–1.0)
Monocytes Relative: 7 %
Neutro Abs: 3.9 10*3/uL (ref 1.7–7.7)
Neutrophils Relative %: 54 %

## 2022-09-11 LAB — I-STAT CHEM 8, ED
BUN: 14 mg/dL (ref 6–20)
Calcium, Ion: 1.13 mmol/L — ABNORMAL LOW (ref 1.15–1.40)
Chloride: 99 mmol/L (ref 98–111)
Creatinine, Ser: 1.4 mg/dL — ABNORMAL HIGH (ref 0.61–1.24)
Glucose, Bld: 90 mg/dL (ref 70–99)
HCT: 43 % (ref 39.0–52.0)
Hemoglobin: 14.6 g/dL (ref 13.0–17.0)
Potassium: 4.1 mmol/L (ref 3.5–5.1)
Sodium: 137 mmol/L (ref 135–145)
TCO2: 25 mmol/L (ref 22–32)

## 2022-09-11 LAB — CBC
HCT: 42.3 % (ref 39.0–52.0)
Hemoglobin: 14.7 g/dL (ref 13.0–17.0)
MCH: 29.7 pg (ref 26.0–34.0)
MCHC: 34.8 g/dL (ref 30.0–36.0)
MCV: 85.5 fL (ref 80.0–100.0)
Platelets: 183 10*3/uL (ref 150–400)
RBC: 4.95 MIL/uL (ref 4.22–5.81)
RDW: 13.7 % (ref 11.5–15.5)
WBC: 7.2 10*3/uL (ref 4.0–10.5)
nRBC: 0 % (ref 0.0–0.2)

## 2022-09-11 LAB — PROTIME-INR
INR: 1.1 (ref 0.8–1.2)
Prothrombin Time: 13.7 seconds (ref 11.4–15.2)

## 2022-09-11 LAB — COMPREHENSIVE METABOLIC PANEL
ALT: 35 U/L (ref 0–44)
AST: 24 U/L (ref 15–41)
Albumin: 4.1 g/dL (ref 3.5–5.0)
Alkaline Phosphatase: 63 U/L (ref 38–126)
Anion gap: 10 (ref 5–15)
BUN: 14 mg/dL (ref 6–20)
CO2: 25 mmol/L (ref 22–32)
Calcium: 9 mg/dL (ref 8.9–10.3)
Chloride: 100 mmol/L (ref 98–111)
Creatinine, Ser: 1.19 mg/dL (ref 0.61–1.24)
GFR, Estimated: >60 mL/min (ref >60)
Glucose, Bld: 93 mg/dL (ref 70–99)
Potassium: 4 mmol/L (ref 3.5–5.1)
Sodium: 135 mmol/L (ref 135–145)
Total Bilirubin: 0.9 mg/dL (ref 0.3–1.2)
Total Protein: 7.3 g/dL (ref 6.5–8.1)

## 2022-09-11 LAB — CBG MONITORING, ED: Glucose-Capillary: 96 mg/dL (ref 70–99)

## 2022-09-11 LAB — APTT: aPTT: 30 seconds (ref 24–36)

## 2022-09-11 LAB — ETHANOL: Alcohol, Ethyl (B): <10 mg/dL (ref <10)

## 2022-09-11 MED ORDER — DIPHENHYDRAMINE HCL 50 MG/ML IJ SOLN
25.0000 mg | Freq: Once | INTRAMUSCULAR | Status: AC
  Administered 2022-09-11: 25 mg via INTRAVENOUS
  Filled 2022-09-11: qty 1

## 2022-09-11 MED ORDER — PROCHLORPERAZINE EDISYLATE 10 MG/2ML IJ SOLN
10.0000 mg | Freq: Once | INTRAMUSCULAR | Status: AC
  Administered 2022-09-11: 10 mg via INTRAVENOUS
  Filled 2022-09-11: qty 2

## 2022-09-11 MED ORDER — SODIUM CHLORIDE 0.9% FLUSH
3.0000 mL | Freq: Once | INTRAVENOUS | Status: AC
  Administered 2022-09-11: 3 mL via INTRAVENOUS

## 2022-09-11 MED ORDER — LACTATED RINGERS IV BOLUS
1000.0000 mL | Freq: Once | INTRAVENOUS | Status: AC
  Administered 2022-09-11: 1000 mL via INTRAVENOUS

## 2022-09-11 NOTE — Code Documentation (Signed)
Responded to Code Stroke called at 1849 for L sided weakness and aphasia, [id number]. Pt arrived at 1908, CBG-96, NIH-19(15 after CT scan done), CT head negative for acute changes. Pt taken to MRI at 1925. MRI negative for stroke. Code Stroke cancelled at [place].

## 2022-09-11 NOTE — ED Notes (Signed)
Shift report received, assumed care of patient at this time 

## 2022-09-11 NOTE — ED Triage Notes (Signed)
Patient BIB EMS from home with c/o headache, left sided weakness, non-verbal. Last known normal @ 1830. Vss, 18g L hand, 18g RAC.

## 2022-09-11 NOTE — Consult Note (Signed)
Neurology Consultation Reason for Consult: Code stroke Requesting Physician: Gareth Morgan  CC: AMS  History is obtained from: Wife, patient and chart review   HPI: Roy Snyder is a 47 y.o. with a past medical history significant for anxiety/depression, obstructive sleep apnea, BMI 43,  He last presented April 12, 2021 after an awake unresponsive episode where he had sudden onset of headache and then collapsed onto the floor with eyes open while speaking with his son and was nonverbal, not following commands with weakness and numbness on the right side per EMS.  Could not appropriately to questions.  At that time it was reported he had had 2 prior similar episodes in the past with full spontaneous resolution.  MRI brain was negative and CTA was negative.  He was admitted, PT/OT evaluations completed and he was discharged making a gradual recovery over months with the help of physical therapy and Occupational Therapy.  Wife notes that this was coded as a TIA to her knowledge but she does not have MyChart access and had not reviewed neurology notes documenting this as conversion disorder  LKW: 5:45 PM Thrombolytic given?: No, stroke ruled out with MRI IA performed?: No, exam not consistent with LVO Premorbid modified rankin scale:      0 - No symptoms.  ROS: All other review of systems was negative except as noted in the HPI, based on patient nodding and shaking his head and confirmed with wife at bedside  Past Medical History:  Diagnosis Date   Anxiety    Sleep apnea    Past Surgical History:  Procedure Laterality Date   VENOUS ABLATION Bilateral    Current Outpatient Medications  Medication Instructions   ascorbic acid (VITAMIN C) 500 mg, Oral, Daily   cholecalciferol (VITAMIN D3) 1,000 Units, Oral, Daily   dicyclomine (BENTYL) 20 mg, Oral, As directed, Take 1 tablet BID AS WELL AS WHEN EATING A MEAL   escitalopram (LEXAPRO) 10 mg, Oral, Daily at bedtime   famotidine  (PEPCID) 20 mg, Oral, 2 times daily   fluticasone (FLONASE) 50 MCG/ACT nasal spray 1 spray, Each Nare, Daily   loperamide (IMODIUM) 2 mg, Oral, 2 times daily PRN   ondansetron (ZOFRAN-ODT) 4 mg, Oral, Every 8 hours PRN   pantoprazole (PROTONIX) 40 mg, Oral, Daily   zinc gluconate 50 mg, Oral, Daily  Wife reports he is taking only escitalopram, has stopped daily controller medication for migraines, but does take some migraine medications as needed  No family history on file.  Social History:  reports that he has never smoked. He has never used smokeless tobacco. He reports that he does not currently use alcohol. He reports that he does not use drugs.   Exam: Current vital signs: Wt (!) 142 kg   BMI 43.66 kg/m  Vital signs in last 24 hours: Weight:  [142 kg] 142 kg (02/07 1917)   Physical Exam  Constitutional: Appears well-developed and well-nourished.  Psych: Affect flat Eyes: No scleral injection HENT: No oropharyngeal obstruction.  MSK: no joint deformities.  Cardiovascular: Normal rate and regular rhythm. Perfusing extremities well Respiratory: Effort normal, non-labored breathing GI: Soft.  No distension. There is no tenderness.  Skin: Warm dry and intact visible skin  Neuro: Mental Status: Patient is awake, alert, can answer orientation questions by nodding yes/no appropriately and holding up fingers, indicating he is 47 years old and it is February Cranial Nerves: II: Visual Fields are full. Pupils are equal, round, and reactive to light.   III,IV, VI:  EOMI without ptosis or diploplia.  V: Facial sensation is symmetric to eyelash brush VII: Facial movement is symmetric.  VIII: hearing is intact to voice Motor: Fluctuating weakness, noted to use the hands more spontaneously than to command.  Left upper extremity for example flopped straight down but protects the face. Sensory: By nodding and shaking his head he indicates some subjective change in sensation on the left  side Cerebellar: Unable to assess secondary to patient's mental status/participation Gait:  Deferred  NIHSS total 19 Score breakdown: 2 points for Performed at  time of patient arrival to ED    I have reviewed labs in epic and the results pertinent to this consultation are:  Basic Metabolic Panel: Recent Labs  Lab 09/11/22 1916  NA 137  K 4.1  CL 99  GLUCOSE 90  BUN 14  CREATININE 1.40*    CBC: Recent Labs  Lab 09/11/22 1913 09/11/22 1916  WBC 7.2  --   NEUTROABS 3.9  --   HGB 14.7 14.6  HCT 42.3 43.0  MCV 85.5  --   PLT 183  --     Coagulation Studies: Recent Labs    09/11/22 1913  LABPROT 13.7  INR 1.1      I have reviewed the images obtained:  CT head 1. Negative head CT.  No acute intracranial abnormality. 2. ASPECTS is 10.  MRI brain personally reviewed, agree with radiology:   Normal examination. No abnormality seen to explain the presenting symptoms.  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  [email or web address] handout on functional limb weakness provided to wife at bedside and reviewed with wife and patient.  Discussed expected recovery again, potential need for physical therapy/Occupational Therapy, and given potential trigger of migraine headache, strong recommendation for long-term migraine preventative medications in addition to abortive medications  Recommendations: -Migraine cocktails -Supportive care for functional neurological disorder to include PT/OT if needed -Outpatient follow-up with neurology for headache management, patient has been seen at [facility], last by Dr. Leta Baptist in September 2022 -Inpatient neurology will be available on an as-needed basis, please reach out  if any additional questions or concerns arise -Recommendations conveyed to Dr. Billy Fischer, ED physician, via secure chat  Lesleigh Noe MD-PhD [facility] [phone] Available 7 AM to 7 PM, outside these hours please contact Neurologist on call listed on [place] Performed by: Lorenza Chick   Total critical care time: 45 minutes  Critical care time was exclusive of separately billable procedures and treating other patients.  Critical care was necessary to treat or prevent imminent or life-threatening deterioration, emergent evaluation for consideration of thrombolytic or thrombectomy  Critical care was time spent personally by me on the following activities: development of treatment plan with patient and/or surrogate as well as nursing, discussions with consultants, evaluation of patient's response to treatment, examination of patient, obtaining history from patient or surrogate, ordering and performing treatments and interventions, ordering and review of laboratory studies, ordering and review of radiographic studies, pulse oximetry and re-evaluation of patient's condition.

## 2022-09-12 NOTE — ED Provider Notes (Signed)
[facility] Provider Note   CSN: [id number] Arrival date & time: 09/11/22  2376  An emergency department physician performed an initial assessment on this suspected stroke patient at 48.  History  No chief complaint on file.   Jamien Casanova is a 47 y.o. male.  HPI      47yo male with history of anxiety, sleep apnea, depression, prior neurologic spell diagnosed at time as TIA with negative MRI however longer lasting deficits per family, headaches who presents as a CODE STROKE with EMS.  Wife reports he had seemed off in the type of subtle way you could only tell by being married 20 years and asked him if he wanted to talk.  They laid down in bed and were speaking calmly about the church.  This is the last thing he remembers.  After that his wife states he stood up very agitated and shouted "I'm just not going to do it" and then continued to appear agitated, yelling, not himself. He then went to his room and sat there and when she went in to talk to him found him unable to speak and he fell back on the bed and had weakness.  With EMS he had mild right sided weankess and more severe left sided weakness.   He had taken himself off of all of his regular medicines and was trying natural supplements until about 1 month ago when he resumed his medications and still has not resumed his migraine medication.  He initially could not provide history but later reports only remembering talking in bed then beinging in the ED> Develped headache, had some yesterday and worsened today. Denies any other recent illness, chest pain, dyspnea, nausea, vomiting. New medications, etoh, drugs. He started his depression medicines about one month ago.        Past Medical History:  Diagnosis Date   Anxiety    Sleep apnea      Home Medications Prior to Admission medications   Medication Sig Start Date End Date Taking? Authorizing Provider  cholecalciferol  (VITAMIN D3) 25 MCG (1000 UNIT) tablet Take 1,000 Units by mouth daily.    [provider]  dicyclomine (BENTYL) 20 MG tablet Take 20 mg by mouth as directed. Take 1 tablet BID AS WELL AS WHEN EATING A MEAL 03/29/21   [provider]  escitalopram (LEXAPRO) 10 MG tablet Take 10 mg by mouth at bedtime. 06/05/20   [provider]  famotidine (PEPCID) 20 MG tablet Take 20 mg by mouth 2 (two) times daily. 03/29/21   [provider]  fluticasone (FLONASE) 50 MCG/ACT nasal spray Place 1 spray into both nostrils daily.    [provider]  loperamide (IMODIUM) 2 MG capsule Take 2 mg by mouth 2 (two) times daily as needed for diarrhea or loose stools. 03/29/21   [provider]  ondansetron (ZOFRAN-ODT) 4 MG disintegrating tablet Take 4 mg by mouth every 8 (eight) hours as needed for nausea or vomiting. 12/08/20   [provider]  pantoprazole (PROTONIX) 40 MG tablet Take 40 mg by mouth daily. 03/13/21   [provider]  vitamin C (ASCORBIC ACID) 500 MG tablet Take 500 mg by mouth daily.    [provider]  zinc gluconate 50 MG tablet Take 50 mg by mouth daily.    [provider]      Allergies    Patient has no known allergies.    Review of Systems  Review of Systems  Physical Exam Updated Vital Signs BP 102/65   Pulse 60   Temp 98.6 F (37 C)   Resp 17   Wt (!) 142 kg   SpO2 99%   BMI 43.66 kg/m  Physical Exam Constitutional:      General: He is not in acute distress.    Appearance: Normal appearance. He is not ill-appearing.  HENT:     Head: Normocephalic and atraumatic.  Eyes:     General: No visual field deficit.    Extraocular Movements: Extraocular movements intact.     Conjunctiva/sclera: Conjunctivae normal.     Pupils: Pupils are equal, round, and reactive to light.  Cardiovascular:     Rate and Rhythm: Normal rate and regular rhythm.     Pulses: Normal pulses.  Pulmonary:     Effort:  Pulmonary effort is normal. No respiratory distress.  Musculoskeletal:        General: No swelling or tenderness.     Cervical back: Normal range of motion.  Skin:    General: Skin is warm and dry.     Findings: No erythema or rash.  Neurological:     General: No focal deficit present.     Mental Status: He is alert and oriented to person, place, and time.     GCS: GCS eye subscore is 4. GCS verbal subscore is 5. GCS motor subscore is 6.     Cranial Nerves: No cranial nerve deficit, dysarthria or facial asymmetry.     Sensory: No sensory deficit.     Motor: No weakness or tremor.     Coordination: Coordination normal. Finger-Nose-Finger Test normal.     Gait: Gait normal.     Comments: (Observed neurology exam performed by neurologist on arrival, exam documented is my personally reexamination after observation in ED)     ED Results / Procedures / Treatments   Labs (all labs ordered are listed, but only abnormal results are displayed) Labs Reviewed  I-STAT CHEM 8, ED - Abnormal; Notable for the following components:      Result Value   Creatinine, Ser 1.40 (*)    Calcium, Ion 1.13 (*)    All other components within normal limits  PROTIME-INR  APTT  CBC  DIFFERENTIAL  COMPREHENSIVE METABOLIC PANEL  ETHANOL  CBG MONITORING, ED    EKG EKG Interpretation  Date/Time:  Wednesday September 11 2022 20:09:39 EST Ventricular Rate:  61 PR Interval:  167 QRS Duration: 101 QT Interval:  426 QTC Calculation: 430 R Axis:   -42 Text Interpretation: Sinus rhythm Left axis deviation No significant change since last tracing Confirmed by Gareth Morgan [phone]) on 09/11/2022 11:16:00 PM  Radiology MR BRAIN WO CONTRAST  Result Date: 09/11/2022 CLINICAL DATA:  Neuro deficit, acute, stroke suspected. EXAM: MRI HEAD WITHOUT CONTRAST TECHNIQUE: Multiplanar, multiecho pulse sequences of the brain and surrounding structures were obtained without intravenous contrast. COMPARISON:  Head CT same  day.  MRI 04/12/2021 FINDINGS: Brain: The brain has a normal appearance without evidence of malformation, atrophy, old or acute small or large vessel infarction, mass lesion, hemorrhage, hydrocephalus or extra-axial collection. Vascular: Major vessels at the base of the brain show flow. Venous sinuses appear patent. Skull and upper cervical spine: Normal. Sinuses/Orbits: Clear/normal. Other: None significant. IMPRESSION: Normal examination. No abnormality seen to explain the presenting symptoms. Electronically Signed   By: Nelson Chimes M.D.   On: 09/11/2022 19:55   CT HEAD CODE STROKE WO CONTRAST  Result Date: 09/11/2022 CLINICAL  DATA:  Code stroke. Initial evaluation for neuro deficit, stroke. EXAM: CT HEAD WITHOUT CONTRAST TECHNIQUE: Contiguous axial images were obtained from the base of the skull through the vertex without intravenous contrast. RADIATION DOSE REDUCTION: This exam was performed according to the departmental dose-optimization program which includes automated exposure control, adjustment of the mA and/or kV according to patient size and/or use of iterative reconstruction technique. COMPARISON:  Comparison made with prior MRI from 04/12/2021. FINDINGS: Brain: Cerebral volume within normal limits for patient age. No evidence for acute intracranial hemorrhage. No findings to suggest acute large vessel territory infarct. No mass lesion, midline shift, or mass effect. Ventricles are normal in size without evidence for hydrocephalus. No extra-axial fluid collection identified. Partially empty sella again noted, stable. Vascular: No hyperdense vessel identified. Skull: Scalp soft tissues demonstrate no acute abnormality. Calvarium intact. Sinuses/Orbits: Globes and orbital soft tissues within normal limits. Visualized paranasal sinuses are clear. No mastoid effusion. ASPECTS [facility] Stroke Program Early CT Score) - Ganglionic level infarction (caudate, lentiform nuclei, internal capsule, insula, M1-M3  cortex): 7 - Supraganglionic infarction (M4-M6 cortex): 3 Total score (0-10 with 10 being normal): 10 IMPRESSION: 1. Negative head CT.  No acute intracranial abnormality. 2. ASPECTS is 10. These results were communicated to Dr. Curly Shores at 7:28 pm on 09/11/2022 by text page via the [facility] messaging system. Electronically Signed   By: Jeannine Boga M.D.   On: 09/11/2022 19:30    Procedures Procedures    Medications Ordered in ED Medications  sodium chloride flush (NS) 0.9 % injection 3 mL (3 mLs Intravenous Given 09/11/22 2004)  prochlorperazine (COMPAZINE) injection 10 mg (10 mg Intravenous Given 09/11/22 2105)  diphenhydrAMINE (BENADRYL) injection 25 mg (25 mg Intravenous Given 09/11/22 2105)  lactated ringers bolus 1,000 mL (0 mLs Intravenous Stopped 09/11/22 2153)    ED Course/ Medical Decision Making/ A&P                               47yo male with history of anxiety, sleep apnea, depression, prior neurologic spell diagnosed at time as TIA with negative MRI however longer lasting deficits per family and Neurology notes discussing possible conversion disorder, headaches who presents as a CODE STROKE with EMS.  Glucose without signficant abnormality. Taken to CT for emergent evaluation.  Noted to have fluctuating symptoms during CODE STROKE evaluation and symptoms not localizing to vascular territory and emergent MRI completed and negative for stroke with presentation most consistent with a functional neurological disorder.  Presentation not consistent with seizure, TIA or TGA.    Labs obtained and personally evaluated by me wihtout acute abnormalities.    Given headache cocktail with improvement of symptoms.  Possible complicated migraine versus functional neurologic disorder.  He and wife given information by Dr. Curly Shores and encouraged follow up with PCP, psychiatry/therapist/neurology.  He has returned to baseline strength and speech at this time following headache cocktail. Patient  discharged in stable condition with understanding of reasons to return.         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

## 2022-12-24 DIAGNOSIS — F32A Depression, unspecified: Secondary | ICD-10-CM | POA: Insufficient documentation

## 2022-12-24 DIAGNOSIS — J1189 Influenza due to unidentified influenza virus with other manifestations: Secondary | ICD-10-CM | POA: Insufficient documentation

## 2023-01-07 DIAGNOSIS — K1120 Sialoadenitis, unspecified: Secondary | ICD-10-CM | POA: Insufficient documentation

## 2023-01-07 DIAGNOSIS — R42 Dizziness and giddiness: Secondary | ICD-10-CM | POA: Insufficient documentation

## 2023-09-02 ENCOUNTER — Encounter: Payer: Self-pay | Admitting: Cardiology

## 2023-09-02 DIAGNOSIS — F419 Anxiety disorder, unspecified: Secondary | ICD-10-CM | POA: Insufficient documentation

## 2023-09-03 NOTE — Progress Notes (Unsigned)
Cardiology Office Note:    Date:  09/04/2023   ID:  Roy Snyder, DOB 07/26/1976, MRN [id number]  PCP:  Roy Handler, NP  Cardiologist:  Roy Herrlich, MD   Referring MD: Roy Handler, NP  ASSESSMENT:    1. Family history of early CAD   2. Dyslipidemia   3. Morbid obesity with BMI of 40.0-44.9, adult (HCC)   4. Obstructive sleep apnea syndrome    PLAN:    In order of problems listed above:  More complicated I thought when I first reviewed his medical records He has a history of previous chest pain previous medical interaction normal stress test has ongoing chest pain profound family history and needs further evaluation Especially previous normal functional testing cardiac CTA is appropriate Also did proportional heart failure in his family echocardiogram for cardiomyopathy He is honest that not check an ApoB and LP(a) to see if he is optimized ApoB less than 60 and screen for hyper LP(a) which would result in changing his lipid-lowering therapy and goals His request I will check iron studies he tells me he is not anemic he has been taking PPI He is quite interested in semaglutide therapy and I think with his obstructive sleep apnea he has a firm indication I will discuss it with his PCP  Next appointment 3 months   Medication Adjustments/Labs and Tests Ordered: Current medicines are reviewed at length with the patient today.  Concerns regarding medicines are outlined above.  Orders Placed This Encounter  Procedures   EKG 12-Lead   No orders of the defined types were placed in this encounter.    No chief complaint on file.   History of Present Illness:    Roy Snyder is a 48 y.o. male who is being seen today for the evaluation of cardiovascular risk at the request of Roy Handler, NP.  He saw his primary care physician 08/12/2023 with concerns of his family history artery disease and recent death of his father.  His medical illnesses include obesity and  obstructive sleep apnea.  He was initiated on lipid-lowering therapy with a statin.  He is a Optician, dispensing with very good healthcare literacy he has had previous chest pain hospitalization evaluation including stress test that have been normal. Very strong family history of premature heart disease father onset earlier in life and died at 62 with recurrent complications after bypass surgery including severe heart failure and a brother who had an MI at age 40 and also has heart failure he said there is a strong family history of heart disease on both sides of his family This is made him very concerned about his prognosis He is lipid-lowering therapy initiating rosuvastatin he is unsure of his family has been screened for LP(a) He is active in his vocation but is limited physically by severe chronic leg pain and obesity he also has obstructive sleep apnea and asked me about the utility of semaglutide therapy which I think has multiple indications in his case and would be appropriate He has been take a PPI and recently found to be anemic he asked me to check iron studies When he has chest pain is nonexertional he describes as a heaviness and ache in the chest that resolved spontaneously No orthopnea syncope palpitation He has no history of congenital rheumatic heart disease  Past Medical History:  Diagnosis Date   Anxiety    Aphasia    Asymptomatic varicose veins of both lower extremities 04/01/2019   Chronic depression 12/24/2022  Diastasis recti 04/01/2019   Edema 11/17/2015   Edema of lower extremity 06/08/2016   Influenza with non-respiratory manifestation 12/24/2022   Morbid obesity with BMI of 40.0-44.9, adult (HCC) 08/16/2016   Obstructive sleep apnea syndrome 08/16/2016   Pain in lower limb 11/17/2015   Parotitis 01/07/2023   Prediabetes 09/20/2020   Shoulder pain 04/02/2015   Sleep apnea    TIA (transient ischemic attack) 04/12/2021   Vertigo 01/07/2023   Weakness     Past Surgical  History:  Procedure Laterality Date   GALLBLADDER SURGERY     VENOUS ABLATION Bilateral     Current Medications: Current Meds  Medication Sig   [employer] ASPIRIN ADULT LOW DOSE 81 MG chewable tablet Chew 81 mg by mouth daily.   dicyclomine (BENTYL) 20 MG tablet Take 20 mg by mouth as directed. Take 1 tablet BID AS WELL AS WHEN EATING A MEAL   divalproex (DEPAKOTE ER) 500 MG 24 hr tablet Take 500 mg by mouth daily.   escitalopram (LEXAPRO) 10 MG tablet Take 10 mg by mouth at bedtime.   meloxicam (MOBIC) 15 MG tablet Take 15 mg by mouth daily.   rosuvastatin (CRESTOR) 5 MG tablet Take 5 mg by mouth daily.   Ubrogepant (UBRELVY) 100 MG TABS Take 100 mg by mouth 2 (two) times daily as needed.     Allergies:   Patient has no known allergies.   Social History   Socioeconomic History   Marital status: Married    Spouse name: Not on file   Number of children: Not on file   Years of education: Not on file   Highest education level: Not on file  Occupational History   Not on file  Tobacco Use   Smoking status: Never   Smokeless tobacco: Never  Vaping Use   Vaping status: Never Used  Substance and Sexual Activity   Alcohol use: Not Currently   Drug use: Never   Sexual activity: Not on file  Other Topics Concern   Not on file  Social History Narrative   Not on file   Social Drivers of Health   Financial Resource Strain: Not on file  Food Insecurity: Not on file  Transportation Needs: Not on file  Physical Activity: Not on file  Stress: Not on file  Social Connections: Not on file     Family History: The patient's family history includes Diabetes in his maternal uncle and paternal grandfather; Heart attack in his brother, father, maternal grandmother, and paternal grandfather; Heart disease in his mother.  ROS:   ROS Please see the history of present illness.     All other systems reviewed and are negative.  EKGs/Labs/Other Studies Reviewed:    The following studies were  reviewed today:   Cardiac Studies & Procedures      ECHOCARDIOGRAM  ECHOCARDIOGRAM COMPLETE 04/13/2021  Narrative ECHOCARDIOGRAM REPORT    Patient Name:   Roy Snyder Date of Exam: 04/13/2021 Medical Rec #:  [id number]      Height:       70.0 in Accession #:    [id number]     Weight:       302.5 lb Date of Birth:  February 19, 1976      BSA:          2.489 m Patient Age:    44 years       BP:           119/90 mmHg Patient Gender: M  HR:           64 bpm. Exam Location:  Inpatient  Procedure: 2D Echo, Cardiac Doppler and Color Doppler  Indications:    TIA  History:        Patient has no prior history of Echocardiogram examinations.  Sonographer:    Neomia Dear RDCS Referring Phys: [id number] [facility] General  IMPRESSIONS   1. Left ventricular ejection fraction, by estimation, is 55 to 60%. The left ventricle has normal function. The left ventricle has no regional wall motion abnormalities. Left ventricular diastolic parameters were normal. 2. Right ventricular systolic function is normal. The right ventricular size is normal. There is normal pulmonary artery systolic pressure. 3. The mitral valve is normal in structure. Trivial mitral valve regurgitation. No evidence of mitral stenosis. 4. The aortic valve was not well visualized. Aortic valve regurgitation is not visualized. No aortic stenosis is present. 5. Aortic dilatation noted. There is mild dilatation of the ascending aorta, measuring 38 mm.  FINDINGS Left Ventricle: Left ventricular ejection fraction, by estimation, is 55 to 60%. The left ventricle has normal function. The left ventricle has no regional wall motion abnormalities. The left ventricular internal cavity size was normal in size. There is no left ventricular hypertrophy. Left ventricular diastolic parameters were normal.  Right Ventricle: The right ventricular size is normal. No increase in right ventricular wall thickness. Right ventricular systolic  function is normal. There is normal pulmonary artery systolic pressure. The tricuspid regurgitant velocity is 2.34 m/s, and with an assumed right atrial pressure of 3 mmHg, the estimated right ventricular systolic pressure is 24.9 mmHg.  Left Atrium: Left atrial size was normal in size.  Right Atrium: Right atrial size was normal in size.  Pericardium: Trivial pericardial effusion is present.  Mitral Valve: The mitral valve is normal in structure. Trivial mitral valve regurgitation. No evidence of mitral valve stenosis.  Tricuspid Valve: The tricuspid valve is normal in structure. Tricuspid valve regurgitation is trivial.  Aortic Valve: The aortic valve was not well visualized. Aortic valve regurgitation is not visualized. No aortic stenosis is present. Aortic valve mean gradient measures 4.0 mmHg. Aortic valve peak gradient measures 7.0 mmHg. Aortic valve area, by VTI measures 3.79 cm.  Pulmonic Valve: The pulmonic valve was not well visualized. Pulmonic valve regurgitation is not visualized.  Aorta: Aortic dilatation noted and the aortic root is normal in size and structure. There is mild dilatation of the ascending aorta, measuring 38 mm.  IAS/Shunts: The interatrial septum was not well visualized.   LEFT VENTRICLE PLAX 2D LVIDd:         5.80 cm      Diastology LVIDs:         3.70 cm      LV e' medial:    6.31 cm/s LV PW:         1.00 cm      LV E/e' medial:  14.1 LV IVS:        0.80 cm      LV e' lateral:   10.50 cm/s LVOT diam:     2.40 cm      LV E/e' lateral: 8.5 LV SV:         108 LV SV Index:   43 LVOT Area:     4.52 cm  LV Volumes (MOD) LV vol d, MOD A2C: 139.0 ml LV vol d, MOD A4C: 105.0 ml LV vol s, MOD A2C: 60.9 ml LV vol s, MOD A4C: 55.5 ml LV  SV MOD A2C:     78.1 ml LV SV MOD A4C:     105.0 ml LV SV MOD BP:      65.7 ml  RIGHT VENTRICLE RV Basal diam:  3.60 cm RV Mid diam:    3.10 cm  LEFT ATRIUM             Index       RIGHT ATRIUM           Index LA  Vol (A2C):   46.4 ml 18.65 ml/m RA Area:     18.40 cm LA Vol (A4C):   50.1 ml 20.13 ml/m RA Volume:   56.30 ml  22.62 ml/m LA Biplane Vol: 53.4 ml 21.46 ml/m AORTIC VALVE                   PULMONIC VALVE AV Area (Vmax):    3.84 cm    PV Vmax:       0.95 m/s AV Area (Vmean):   3.62 cm    PV Vmean:      65.200 cm/s AV Area (VTI):     3.79 cm    PV VTI:        0.194 m AV Vmax:           132.00 cm/s PV Peak grad:  3.6 mmHg AV Vmean:          90.500 cm/s PV Mean grad:  2.0 mmHg AV VTI:            0.284 m AV Peak Grad:      7.0 mmHg AV Mean Grad:      4.0 mmHg LVOT Vmax:         112.00 cm/s LVOT Vmean:        72.400 cm/s LVOT VTI:          0.238 m LVOT/AV VTI ratio: 0.84  AORTA Ao Root diam: 4.00 cm Ao Asc diam:  3.80 cm  MITRAL VALVE               TRICUSPID VALVE MV Area (PHT): 3.60 cm    TR Peak grad:   21.9 mmHg MV Decel Time: 211 msec    TR Vmax:        234.00 cm/s MV E velocity: 88.80 cm/s MV A velocity: 97.00 cm/s  SHUNTS MV E/A ratio:  0.92        Systemic VTI:  0.24 m Systemic Diam: 2.40 cm  Epifanio Lesches MD Electronically signed by Epifanio Lesches MD Signature Date/Time: 04/13/2021/10:17:07 AM    Final             EKG Interpretation Date/Time:  Thursday September 04 2023 13:59:46 EST Ventricular Rate:  74 PR Interval:  160 QRS Duration:  84 QT Interval:  396 QTC Calculation: 439 R Axis:   -41  Text Interpretation: Normal sinus rhythm Left axis deviation When compared with ECG of 11-Sep-2022 20:09, PREVIOUS ECG IS PRESENT Confirmed by Roy Snyder ([id number]) on 09/04/2023 2:21:20 PM   Recent Lipid Panel    Component Value Date/Time   CHOL 170 04/13/2021 0057   TRIG 186 (H) 04/13/2021 0057   HDL 34 (L) 04/13/2021 0057   CHOLHDL 5.0 04/13/2021 0057   VLDL 37 04/13/2021 0057   LDLCALC 99 04/13/2021 0057    Physical Exam:    VS:  BP (!) 120/90   Pulse 74   Ht 5\' 11"  (1.803 m)   Wt (!) 310 lb 12.8 oz (141 kg)  SpO2 96%   BMI 43.35 kg/m      Wt Readings from Last 3 Encounters:  09/04/23 (!) 310 lb 12.8 oz (141 kg)  09/11/22 (!) 313 lb 0.9 oz (142 kg)  04/18/21 275 lb (124.7 kg)     GEN:  Well nourished, well developed in no acute distress HEENT: Normal NECK: No JVD; No carotid bruits LYMPHATICS: No lymphadenopathy CARDIAC: RRR, no murmurs, rubs, gallops RESPIRATORY:  Clear to auscultation without rales, wheezing or rhonchi  ABDOMEN: Soft, non-tender, non-distended MUSCULOSKELETAL:  No edema; No deformity  SKIN: Warm and dry NEUROLOGIC:  Alert and oriented x 3 PSYCHIATRIC:  Normal affect     Signed, Roy Herrlich, MD  09/04/2023 2:24 PM    [facility]

## 2023-09-04 ENCOUNTER — Encounter: Payer: Self-pay | Admitting: Cardiology

## 2023-09-04 ENCOUNTER — Ambulatory Visit: Payer: 59 | Attending: Cardiology | Admitting: Cardiology

## 2023-09-04 VITALS — BP 120/90 | HR 74 | Ht 71.0 in | Wt 310.8 lb

## 2023-09-04 DIAGNOSIS — E785 Hyperlipidemia, unspecified: Secondary | ICD-10-CM | POA: Diagnosis not present

## 2023-09-04 DIAGNOSIS — R072 Precordial pain: Secondary | ICD-10-CM | POA: Diagnosis not present

## 2023-09-04 DIAGNOSIS — G4733 Obstructive sleep apnea (adult) (pediatric): Secondary | ICD-10-CM

## 2023-09-04 DIAGNOSIS — Z8249 Family history of ischemic heart disease and other diseases of the circulatory system: Secondary | ICD-10-CM

## 2023-09-04 DIAGNOSIS — Z6841 Body Mass Index (BMI) 40.0 and over, adult: Secondary | ICD-10-CM

## 2023-09-04 DIAGNOSIS — F419 Anxiety disorder, unspecified: Secondary | ICD-10-CM

## 2023-09-04 MED ORDER — METOPROLOL TARTRATE 100 MG PO TABS: 100.0000 mg | ORAL_TABLET | Freq: Once | ORAL | 0 refills | Status: DC

## 2023-09-04 NOTE — Patient Instructions (Signed)
Medication Instructions:  Your physician recommends that you continue on your current medications as directed. Please refer to the Current Medication list given to you today.  *If you need a refill on your cardiac medications before your next appointment, please call your pharmacy*   Lab Work: Your physician recommends that you return for lab work in:   Labs today: BMP, Apo B, Lpa, Ferritin, Iron, TIBC saturation  If you have labs (blood work) drawn today and your tests are completely normal, you will receive your results only by: MyChart Message (if you have MyChart) OR A paper copy in the mail If you have any lab test that is abnormal or we need to change your treatment, we will call you to review the results.   Testing/Procedures: Your physician has requested that you have an echocardiogram. Echocardiography is a painless test that uses sound waves to create images of your heart. It provides your doctor with information about the size and shape of your heart and how well your heart's chambers and valves are working. This procedure takes approximately one hour. There are no restrictions for this procedure. Please do NOT wear cologne, perfume, aftershave, or lotions (deodorant is allowed). Please arrive 15 minutes prior to your appointment time.  Please note: We ask at that you not bring children with you during ultrasound (echo/ vascular) testing. Due to room size and safety concerns, children are not allowed in the ultrasound rooms during exams. Our front office staff cannot provide observation of children in our lobby area while testing is being conducted. An adult accompanying a patient to their appointment will only be allowed in the ultrasound room at the discretion of the ultrasound technician under special circumstances. We apologize for any inconvenience.    Your cardiac CT will be scheduled at one of the below locations:   [facility] [address] [place], [place] [address] [phone]  OR  [facility] [address] [place], [place] [address] [phone]  OR   [facility] [address] [place], [place] [address] [phone]  OR   [facility] [address] [place], [place] [address] [phone]  If scheduled at [facility], please arrive at the [facility] and Children's Entrance (Entrance C2) of [facility] 30 minutes prior to test start time. You can use the FREE valet parking offered at entrance C (encouraged to control the heart rate for the test)  Proceed to the [facility] Radiology Department (first floor) to check-in and test prep.  All radiology patients and guests should use entrance C2 at [facility], accessed from [facility], even though the hospital's physical address listed is [address].    If scheduled at [facility] or [facility], please arrive 15 mins early for check-in and test prep.  There is spacious parking and easy access to the radiology department from the [facility] entrance. Please enter here and check-in with the desk attendant.   If scheduled at [place], please arrive 30 minutes early for check-in and test prep.  Please follow these instructions carefully (unless otherwise directed):  An IV will be required for this test and Nitroglycerin will be given.  Hold all erectile dysfunction medications at least 3 days (72 hrs) prior to test. (Ie viagra, cialis, sildenafil, tadalafil, etc)   On the Night Before the Test: Be sure to  Drink plenty of water. Do not consume any caffeinated/decaffeinated beverages or chocolate 12 hours prior to your test. Do not take any antihistamines 12 hours prior to your test.  On the Day of the Test: Drink plenty of water until 1 hour prior to the  test. Do not eat any food 1 hour prior to test. You may take your regular medications prior to the test.  Take metoprolol (Lopressor) two hours prior to test. Patients who wear a continuous glucose monitor MUST remove the device prior to scanning.      After the Test: Drink plenty of water. After receiving IV contrast, you may experience a mild flushed feeling. This is normal. On occasion, you may experience a mild rash up to 24 hours after the test. This is not dangerous. If this occurs, you can take Benadryl 25 mg, Zyrtec, Claritin, or Allegra and increase your fluid intake. (Patients taking Tikosyn should avoid Benadryl, and may take Zyrtec, Claritin, or Allegra) If you experience trouble breathing, this can be serious. If it is severe call 911 IMMEDIATELY. If it is mild, please call our office.  We will call to schedule your test 2-4 weeks out understanding that some insurance companies will need an authorization prior to the service being performed.   For more information and frequently asked questions, please visit our website : [email or web address]  For non-scheduling related questions, please contact the cardiac imaging nurse navigator should you have any questions/concerns: Cardiac Imaging Nurse Navigators Direct Office Dial: [phone]   For scheduling needs, including cancellations and rescheduling, please call [place], [phone].    Follow-Up: At [facility], you and your health needs are our priority.  As part of our continuing mission to provide you with exceptional heart care, we have created designated Provider Care Teams.  These Care Teams include your primary Cardiologist (physician) and Advanced Practice Providers (APPs -  Physician Assistants and Nurse Practitioners) who all work together to provide you with the care you need, when you need it.  We recommend signing up for the patient portal called "MyChart".  Sign up information is provided  on this After Visit Summary.  MyChart is used to connect with patients for Virtual Visits (Telemedicine).  Patients are able to view lab/test results, encounter notes, upcoming appointments, etc.  Non-urgent messages can be sent to your provider as well.   To learn more about what you can do with MyChart, go to [email or web address].    Your next appointment:   3 month(s)  Provider:   Norman Herrlich, MD    Other Instructions None

## 2023-09-05 ENCOUNTER — Encounter: Payer: Self-pay | Admitting: Cardiology

## 2023-09-05 LAB — BASIC METABOLIC PANEL
BUN/Creatinine Ratio: 22 — ABNORMAL HIGH (ref 9–20)
BUN: 20 mg/dL (ref 6–24)
CO2: 22 mmol/L (ref 20–29)
Calcium: 9.5 mg/dL (ref 8.7–10.2)
Chloride: 97 mmol/L (ref 96–106)
Creatinine, Ser: 0.92 mg/dL (ref 0.76–1.27)
Glucose: 71 mg/dL (ref 70–99)
Potassium: 4.2 mmol/L (ref 3.5–5.2)
Sodium: 137 mmol/L (ref 134–144)
eGFR: 104 mL/min/{1.73_m2} (ref >59)

## 2023-09-05 LAB — IRON,TIBC AND FERRITIN PANEL
Ferritin: 309 ng/mL (ref 30–400)
Iron Saturation: 26 % (ref 15–55)
Iron: 83 ug/dL (ref 38–169)
Total Iron Binding Capacity: 318 ug/dL (ref 250–450)
UIBC: 235 ug/dL (ref 111–343)

## 2023-09-05 LAB — LIPOPROTEIN A (LPA): Lipoprotein (a): 11 nmol/L (ref <75.0)

## 2023-09-05 LAB — APOLIPOPROTEIN B: Apolipoprotein B: 71 mg/dL (ref <90)

## 2023-09-15 ENCOUNTER — Encounter (HOSPITAL_COMMUNITY): Payer: Self-pay

## 2023-09-16 ENCOUNTER — Other Ambulatory Visit (HOSPITAL_COMMUNITY): Payer: 59

## 2023-09-17 ENCOUNTER — Ambulatory Visit (HOSPITAL_COMMUNITY)
Admission: RE | Admit: 2023-09-17 | Discharge: 2023-09-17 | Disposition: A | Discharge status: Home / Self Care | Payer: 59 | Source: Ambulatory Visit | Attending: Cardiology | Admitting: Cardiology

## 2023-09-17 DIAGNOSIS — R072 Precordial pain: Secondary | ICD-10-CM | POA: Insufficient documentation

## 2023-09-17 MED ORDER — IOHEXOL 350 MG/ML SOLN
100.0000 mL | Freq: Once | INTRAVENOUS | Status: AC | PRN
  Administered 2023-09-17: 100 mL via INTRAVENOUS

## 2023-09-17 MED ORDER — NITROGLYCERIN 0.4 MG SL SUBL
0.8000 mg | SUBLINGUAL_TABLET | Freq: Once | SUBLINGUAL | Status: AC
  Administered 2023-09-17: 0.8 mg via SUBLINGUAL

## 2023-09-17 MED ORDER — NITROGLYCERIN 0.4 MG SL SUBL
SUBLINGUAL_TABLET | SUBLINGUAL | Status: AC
Start: 2023-09-17 — End: ?
  Filled 2023-09-17: qty 2

## 2023-09-18 ENCOUNTER — Telehealth: Payer: Self-pay

## 2023-09-18 NOTE — Telephone Encounter (Signed)
   [facility] Pre-operative Risk Assessment    Request for surgical clearance:  What type of surgery is being performed? Colonoscopy    When is this surgery scheduled? October 28, 2023   What type of clearance is required (medical clearance vs. Pharmacy clearance to hold med vs. Both)? Both  Are there any medications that need to be held prior to surgery and how long? Aspirin needs to be held starting on March 20th  Practice name and name of physician performing surgery? Dr. Shanda Howells with [facility]    What is your office phone number [phone]    7.   What is your office fax number [phone]  8.   Anesthesia type (None, local, MAC, general) ? Not indicated    [place] Roy Snyder 09/18/2023, 4:47 PM  _________________________________________________________________   (provider comments below)

## 2023-09-19 ENCOUNTER — Other Ambulatory Visit: Payer: Self-pay

## 2023-09-19 DIAGNOSIS — I7789 Other specified disorders of arteries and arterioles: Secondary | ICD-10-CM

## 2023-09-19 NOTE — Telephone Encounter (Addendum)
   Patient Name: Roy Snyder  DOB: Feb 08, 1976 MRN: [id number]  Primary Cardiologist: None  Chart reviewed as part of pre-operative protocol coverage. Given past medical history and time since last visit, based on ACC/AHA guidelines, Chrsitopher Wik is at acceptable risk for the planned procedure without further cardiovascular testing.   The patient was advised that if he develops new symptoms prior to surgery to contact our office to arrange for a follow-up visit, and he verbalized understanding.  Per protocol patient can hold ASA 81 mg for 7 days prior to procedure and should restart postprocedure when surgically safe and hemostasis is achieved.  I will route this recommendation to the requesting party via Epic fax function and remove from pre-op pool.  Please call with questions.  Napoleon Form, Leodis Rains, NP 09/19/2023, 7:09 AM

## 2023-09-26 ENCOUNTER — Ambulatory Visit: Payer: 59

## 2023-10-17 ENCOUNTER — Ambulatory Visit: Payer: 59 | Attending: Cardiology

## 2023-10-17 ENCOUNTER — Encounter: Payer: Self-pay | Admitting: Cardiology

## 2023-10-17 DIAGNOSIS — R072 Precordial pain: Secondary | ICD-10-CM

## 2023-10-17 DIAGNOSIS — Z8249 Family history of ischemic heart disease and other diseases of the circulatory system: Secondary | ICD-10-CM | POA: Diagnosis not present

## 2023-10-17 DIAGNOSIS — Z6841 Body Mass Index (BMI) 40.0 and over, adult: Secondary | ICD-10-CM

## 2023-10-17 DIAGNOSIS — G4733 Obstructive sleep apnea (adult) (pediatric): Secondary | ICD-10-CM

## 2023-10-17 DIAGNOSIS — E785 Hyperlipidemia, unspecified: Secondary | ICD-10-CM

## 2023-10-17 LAB — ECHOCARDIOGRAM COMPLETE
AR max vel: 3.68 cm2
AV Area VTI: 4.48 cm2
AV Area mean vel: 3.95 cm2
AV Mean grad: 3.5 mmHg
AV Peak grad: 7.6 mmHg
Ao pk vel: 1.38 m/s
Area-P 1/2: 3.48 cm2
S' Lateral: 3.4 cm

## 2023-11-28 ENCOUNTER — Ambulatory Visit: Attending: Cardiology | Admitting: Cardiology

## 2023-11-28 ENCOUNTER — Encounter: Payer: Self-pay | Admitting: Cardiology

## 2023-11-28 VITALS — BP 120/72 | HR 62 | Ht 71.0 in | Wt 277.8 lb

## 2023-11-28 DIAGNOSIS — R931 Abnormal findings on diagnostic imaging of heart and coronary circulation: Secondary | ICD-10-CM | POA: Insufficient documentation

## 2023-11-28 DIAGNOSIS — Z6841 Body Mass Index (BMI) 40.0 and over, adult: Secondary | ICD-10-CM

## 2023-11-28 DIAGNOSIS — R7303 Prediabetes: Secondary | ICD-10-CM

## 2023-11-28 DIAGNOSIS — E785 Hyperlipidemia, unspecified: Secondary | ICD-10-CM | POA: Diagnosis not present

## 2023-11-28 MED ORDER — ROSUVASTATIN CALCIUM 10 MG PO TABS
10.0000 mg | ORAL_TABLET | Freq: Every day | ORAL | 3 refills | Status: AC
Start: 2023-11-28 — End: 2024-02-26

## 2023-11-28 NOTE — Addendum Note (Signed)
 Addended by: Shawnee Dellen D on: 11/28/2023 11:08 AM   Modules accepted: Orders

## 2023-11-28 NOTE — Progress Notes (Signed)
 Cardiology Office Note:    Date:  11/28/2023   ID:  Roy Snyder, DOB [birth year], MRN [id number]  PCP:  Trudi Fus, NP  Cardiologist:  Ralene Burger, MD    Referring MD: Trudi Fus, NP   Chief Complaint  Patient presents with   Follow-up    History of Present Illness:    Roy Snyder is a 48 y.o. male past medical history significant for morbid obesity, chronic depression, dyslipidemia, multiple premature coronary disease in the family, he was referred to us  initially for risk assessment.  Likely he does not have any symptoms there is no chest pain tightness squeezing pressure in the chest.  Quite extensive evaluation has been performed that include coronary CT angio which showed calcium score of 8.24 which is 79 percentile.  He was noted to have between 0 and 24% stenosis in multiple arteries, total plaque volume was only 59 mm3, that was 51 percentile.  He comes to talk about this since he was seen last time he make significant changes he started exercising on the regular basis lost significant mount of weight feeling much better.  Denies have any chest pain tightness squeezing pressure burning chest.  Past Medical History:  Diagnosis Date   Anxiety    Aphasia    Asymptomatic varicose veins of both lower extremities 04/01/2019   Chronic depression 12/24/2022   Diastasis recti 04/01/2019   Edema 11/17/2015   Edema of lower extremity 06/08/2016   Influenza with non-respiratory manifestation 12/24/2022   Morbid obesity with BMI of 40.0-44.9, adult (HCC) 08/16/2016   Obstructive sleep apnea syndrome 08/16/2016   Pain in lower limb 11/17/2015   Parotitis 01/07/2023   Prediabetes 09/20/2020   Shoulder pain 04/02/2015   Sleep apnea    TIA (transient ischemic attack) 04/12/2021   Vertigo 01/07/2023   Weakness     Past Surgical History:  Procedure Laterality Date   GALLBLADDER SURGERY     VENOUS ABLATION Bilateral     Current Medications: Current Meds   Medication Sig   [facility] ASPIRIN  ADULT LOW DOSE 81 MG chewable tablet Chew 81 mg by mouth daily.   divalproex (DEPAKOTE ER) 500 MG 24 hr tablet Take 500 mg by mouth daily.   escitalopram  (LEXAPRO ) 10 MG tablet Take 10 mg by mouth at bedtime.   escitalopram  (LEXAPRO ) 20 MG tablet Take 20 mg by mouth daily.   meloxicam (MOBIC) 15 MG tablet Take 15 mg by mouth daily.   ondansetron  (ZOFRAN -ODT) 4 MG disintegrating tablet Take 4 mg by mouth 2 (two) times daily.   pantoprazole  (PROTONIX ) 40 MG tablet Take 40 mg by mouth daily.   rosuvastatin (CRESTOR) 5 MG tablet Take 5 mg by mouth daily.   Ubrogepant (UBRELVY) 100 MG TABS Take 100 mg by mouth 2 (two) times daily as needed (Migraine).   [DISCONTINUED] metoprolol  tartrate (LOPRESSOR ) 100 MG tablet Take 1 tablet (100 mg total) by mouth once for 1 dose. Please take this medication 2 hours before CT.     Allergies:   Patient has no known allergies.   Social History   Socioeconomic History   Marital status: Married    Spouse name: Not on file   Number of children: Not on file   Years of education: Not on file   Highest education level: Not on file  Occupational History   Not on file  Tobacco Use   Smoking status: Never   Smokeless tobacco: Never  Vaping Use   Vaping status: Never Used  Substance and Sexual Activity   Alcohol use: Not Currently   Drug use: Never   Sexual activity: Not on file  Other Topics Concern   Not on file  Social History Narrative   Not on file   Social Drivers of Health   Financial Resource Strain: Not on file  Food Insecurity: Not on file  Transportation Needs: Not on file  Physical Activity: Not on file  Stress: Not on file  Social Connections: Not on file     Family History: The patient's family history includes Diabetes in his maternal uncle and paternal grandfather; Heart attack in his brother, father, maternal grandmother, and paternal grandfather; Heart disease in his mother. ROS:   Please see the  history of present illness.    All 14 point review of systems negative except as described per history of present illness  EKGs/Labs/Other Studies Reviewed:         Recent Labs: 09/04/2023: BUN 20; Creatinine, Ser 0.92; Potassium 4.2; Sodium 137  Recent Lipid Panel    Component Value Date/Time   CHOL 170 04/13/2021 0057   TRIG 186 (H) 04/13/2021 0057   HDL 34 (L) 04/13/2021 0057   CHOLHDL 5.0 04/13/2021 0057   VLDL 37 04/13/2021 0057   LDLCALC 99 04/13/2021 0057    Physical Exam:    VS:  BP 120/72 (BP Location: Right Arm, Patient Position: Sitting)   Pulse 62   Ht 5\' 11"  (1.803 m)   Wt 277 lb 12.8 oz (126 kg)   SpO2 97%   BMI 38.75 kg/m     Wt Readings from Last 3 Encounters:  11/28/23 277 lb 12.8 oz (126 kg)  09/04/23 (!) 310 lb 12.8 oz (141 kg)  09/11/22 (!) 313 lb 0.9 oz (142 kg)     GEN:  Well nourished, well developed in no acute distress HEENT: Normal NECK: No JVD; No carotid bruits LYMPHATICS: No lymphadenopathy CARDIAC: RRR, no murmurs, no rubs, no gallops RESPIRATORY:  Clear to auscultation without rales, wheezing or rhonchi  ABDOMEN: Soft, non-tender, non-distended MUSCULOSKELETAL:  No edema; No deformity  SKIN: Warm and dry LOWER EXTREMITIES: no swelling NEUROLOGIC:  Alert and oriented x 3 PSYCHIATRIC:  Normal affect   ASSESSMENT:    1. Prediabetes   2. Morbid obesity with BMI of 40.0-44.9, adult (HCC)   3. Dyslipidemia   4. Elevated coronary artery calcium score 8.24, 79 percentile    PLAN:    In order of problems listed above:  Prediabetes.  He lost significant amount of weight we discussed need to exercise on the regular basis which she already does.  Doing well from that point review. Dyslipidemia I did review his blood test his cholesterol still elevated.  Will increase Crestor from 5-10, fasting lipid profile, AST ALT will be checked in 6 weeks. We did spend great deal of time talk about healthy lifestyle I recommended 5 times a week at  least 30 minutes moderate intensity exercise plus we did discuss basic of Mediterranean diet. 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  Will continue monitoring.   Medication Adjustments/Labs and Tests Ordered: Current medicines are reviewed at length with the patient today.  Concerns regarding medicines are outlined above.  No orders of the defined types were placed in this encounter.  Medication changes: No orders of the defined types were placed in this encounter.   Signed, Manfred Seed, MD, [facility] 11/28/2023 10:57 AM    [facility]

## 2023-11-28 NOTE — Patient Instructions (Addendum)
 Medication Instructions:   INCREASE: Crestor 10mg  1 tablet daily   Lab Work: Your physician recommends that you return for lab work in: 6 weeks You need to have labs done when you are fasting.  You can come Monday through Friday 8:30 am to 12:00 pm and 1:15 to 4:30. You do not need to make an appointment as the order has already been placed. The labs you are going to have done are AST, ALT, Lipids.    Testing/Procedures: None Ordered   Follow-Up: At [facility], you and your health needs are our priority.  As part of our continuing mission to provide you with exceptional heart care, we have created designated Provider Care Teams.  These Care Teams include your primary Cardiologist (physician) and Advanced Practice Providers (APPs -  Physician Assistants and Nurse Practitioners) who all work together to provide you with the care you need, when you need it.  We recommend signing up for the patient portal called "MyChart".  Sign up information is provided on this After Visit Summary.  MyChart is used to connect with patients for Virtual Visits (Telemedicine).  Patients are able to view lab/test results, encounter notes, upcoming appointments, etc.  Non-urgent messages can be sent to your provider as well.   To learn more about what you can do with MyChart, go to [email or web address].    Your next appointment:   6 month(s)  The format for your next appointment:   In Person  Provider:   Ralene Burger, MD    Other Instructions NA

## 2023-12-05 ENCOUNTER — Ambulatory Visit: Admitting: Cardiology

## 2023-12-05 ENCOUNTER — Ambulatory Visit

## 2023-12-05 ENCOUNTER — Telehealth: Payer: Self-pay

## 2023-12-05 NOTE — Telephone Encounter (Signed)
   Pre-operative Risk Assessment    Patient Name: Roy Snyder  DOB: March 26, 1976 MRN: [id number]   Date of last office visit: 11/28/23 Date of next office visit: N/A   Request for Surgical Clearance    Procedure:   right partial knee replacement  Date of Surgery:  Clearance TBD                                Surgeon:  Dr. Dorthy Gavia Surgeon's Group or Practice Name:  [facility] Phone number:   Fax number:  [phone]   Type of Clearance Requested:   - Medical    Type of Anesthesia:  Spinal   Additional requests/questions:    SignedBascom Lily   12/05/2023, 8:41 AM

## 2023-12-11 NOTE — Telephone Encounter (Signed)
   Name: Roy Snyder  DOB: 1975-12-17  MRN: [id number]   Primary Cardiologist: None  Chart reviewed as part of pre-operative protocol coverage. Shariq Depolo was last seen on 11/28/2023 by Dr. Gordan Latina.  Per Dr. Krasowski "Should be fine to proceed with surgery from cardiac standpoint."  Therefore, based on ACC/AHA guidelines, the patient would be an acceptable risk for the planned procedure without further cardiovascular testing.   I will route this recommendation to the requesting party via Epic fax function and remove from pre-op pool. Please call with questions.  Morey Ar, NP 12/11/2023, 11:28 AM
